# Patient Record
Sex: Male | Born: 1945 | Race: White | Hispanic: No | Marital: Married | Smoking: Former smoker
Health system: Southern US, Community
[De-identification: ages and names within clinical notes are randomized; demographics above are authoritative.]

## PROBLEM LIST (undated history)

## (undated) DIAGNOSIS — Z860100 Personal history of colon polyps, unspecified: Secondary | ICD-10-CM

## (undated) DIAGNOSIS — E785 Hyperlipidemia, unspecified: Secondary | ICD-10-CM

## (undated) DIAGNOSIS — N320 Bladder-neck obstruction: Secondary | ICD-10-CM

## (undated) DIAGNOSIS — Z8639 Personal history of other endocrine, nutritional and metabolic disease: Secondary | ICD-10-CM

## (undated) DIAGNOSIS — D352 Benign neoplasm of pituitary gland: Secondary | ICD-10-CM

## (undated) DIAGNOSIS — I639 Cerebral infarction, unspecified: Secondary | ICD-10-CM

## (undated) DIAGNOSIS — R7303 Prediabetes: Secondary | ICD-10-CM

## (undated) DIAGNOSIS — Q2730 Arteriovenous malformation, site unspecified: Secondary | ICD-10-CM

## (undated) DIAGNOSIS — J449 Chronic obstructive pulmonary disease, unspecified: Secondary | ICD-10-CM

## (undated) DIAGNOSIS — E559 Vitamin D deficiency, unspecified: Secondary | ICD-10-CM

## (undated) DIAGNOSIS — E291 Testicular hypofunction: Secondary | ICD-10-CM

## (undated) HISTORY — PX: TONSILLECTOMY: SUR1361

---

## 1988-11-05 HISTORY — PX: ACHILLES TENDON REPAIR: SUR1153

## 2007-12-22 ENCOUNTER — Ambulatory Visit: Payer: Self-pay | Admitting: Urology

## 2008-07-05 ENCOUNTER — Ambulatory Visit: Payer: Self-pay | Admitting: Unknown Physician Specialty

## 2008-07-05 HISTORY — PX: COLONOSCOPY: SHX174

## 2009-11-28 ENCOUNTER — Ambulatory Visit: Payer: Self-pay | Admitting: Internal Medicine

## 2011-08-10 ENCOUNTER — Ambulatory Visit: Payer: Self-pay | Admitting: Urology

## 2013-09-07 ENCOUNTER — Ambulatory Visit: Payer: Self-pay | Admitting: Unknown Physician Specialty

## 2013-09-07 HISTORY — PX: COLONOSCOPY: SHX174

## 2014-06-28 ENCOUNTER — Ambulatory Visit: Payer: Self-pay | Admitting: Internal Medicine

## 2014-07-05 ENCOUNTER — Ambulatory Visit: Payer: Self-pay | Admitting: Internal Medicine

## 2015-10-12 DIAGNOSIS — R7303 Prediabetes: Secondary | ICD-10-CM | POA: Insufficient documentation

## 2016-03-26 DIAGNOSIS — D352 Benign neoplasm of pituitary gland: Secondary | ICD-10-CM | POA: Insufficient documentation

## 2016-10-10 ENCOUNTER — Other Ambulatory Visit: Payer: Self-pay | Admitting: Urology

## 2016-10-10 DIAGNOSIS — K862 Cyst of pancreas: Secondary | ICD-10-CM

## 2016-10-22 ENCOUNTER — Ambulatory Visit (HOSPITAL_COMMUNITY)
Admission: RE | Admit: 2016-10-22 | Discharge: 2016-10-22 | Disposition: A | Discharge status: Home / Self Care | Payer: BLUE CROSS/BLUE SHIELD | Source: Ambulatory Visit | Attending: Urology | Admitting: Urology

## 2016-10-22 DIAGNOSIS — K862 Cyst of pancreas: Secondary | ICD-10-CM | POA: Insufficient documentation

## 2016-10-22 DIAGNOSIS — N281 Cyst of kidney, acquired: Secondary | ICD-10-CM | POA: Insufficient documentation

## 2016-10-22 LAB — POCT I-STAT CREATININE: CREATININE: 1 mg/dL (ref 0.61–1.24)

## 2016-10-22 MED ORDER — GADOBENATE DIMEGLUMINE 529 MG/ML IV SOLN
20.0000 mL | Freq: Once | INTRAVENOUS | Status: AC | PRN
  Administered 2016-10-22: 20 mL via INTRAVENOUS

## 2017-04-03 DIAGNOSIS — D239 Other benign neoplasm of skin, unspecified: Secondary | ICD-10-CM

## 2017-04-03 HISTORY — DX: Other benign neoplasm of skin, unspecified: D23.9

## 2017-06-16 ENCOUNTER — Encounter: Payer: Self-pay | Admitting: Emergency Medicine

## 2017-06-16 ENCOUNTER — Emergency Department
Admission: EM | Admit: 2017-06-16 | Discharge: 2017-06-16 | Disposition: A | Discharge status: Home / Self Care | Payer: BLUE CROSS/BLUE SHIELD | Attending: Emergency Medicine | Admitting: Emergency Medicine

## 2017-06-16 DIAGNOSIS — N401 Enlarged prostate with lower urinary tract symptoms: Secondary | ICD-10-CM | POA: Insufficient documentation

## 2017-06-16 DIAGNOSIS — Z87891 Personal history of nicotine dependence: Secondary | ICD-10-CM | POA: Diagnosis not present

## 2017-06-16 DIAGNOSIS — N390 Urinary tract infection, site not specified: Secondary | ICD-10-CM | POA: Insufficient documentation

## 2017-06-16 DIAGNOSIS — R339 Retention of urine, unspecified: Secondary | ICD-10-CM

## 2017-06-16 LAB — URINALYSIS, COMPLETE (UACMP) WITH MICROSCOPIC
Bilirubin Urine: NEGATIVE
Glucose, UA: NEGATIVE mg/dL
Hgb urine dipstick: NEGATIVE
Ketones, ur: NEGATIVE mg/dL
Nitrite: NEGATIVE
PROTEIN: NEGATIVE mg/dL
SPECIFIC GRAVITY, URINE: 1.02 (ref 1.005–1.030)
SQUAMOUS EPITHELIAL / LPF: NONE SEEN
pH: 5 (ref 5.0–8.0)

## 2017-06-16 LAB — CHLAMYDIA/NGC RT PCR (ARMC ONLY)
CHLAMYDIA TR: NOT DETECTED
N gonorrhoeae: NOT DETECTED

## 2017-06-16 MED ORDER — CEPHALEXIN 500 MG PO CAPS: 500.0000 mg | ORAL_CAPSULE | Freq: Two times a day (BID) | ORAL | 0 refills | Status: DC

## 2017-06-16 MED ORDER — CEPHALEXIN 500 MG PO CAPS
500.0000 mg | ORAL_CAPSULE | Freq: Once | ORAL | Status: AC
  Administered 2017-06-16: 500 mg via ORAL
  Filled 2017-06-16: qty 1

## 2017-06-16 NOTE — ED Notes (Signed)
Leg bag placed at this time.

## 2017-06-16 NOTE — Discharge Instructions (Signed)
Please follow-up with your urologist by calling tomorrow for an appointment in the next 7-10 days. Please take her antibiotic as prescribed for its entire course. Return to the emergency department for any fever, abdominal pain, or any other symptom personally concerning to yourself.

## 2017-06-16 NOTE — ED Provider Notes (Signed)
[facility] Emergency Department Provider Note  Time seen: 1:42 PM  I have reviewed the triage vital signs and the nursing notes.   HISTORY  Chief Complaint Urinary Retention    HPI Aaron Lamay. is a 71 y.o. male With a past medical history of BPH who presents to the emergency department for urinary retention and possible urinary tract infection. According to the patient he has chronic BPH, and usually has difficulty urinating. He states since yesterday he has been having very cloudy urine/pus with a subjective fever. Denies any nausea, vomiting, diarrhea. He states since this morning he has been having significant lower abdominal pain/fullness and has been unable to urinate. No history of urinary retention previously.  History reviewed. No pertinent past medical history.  There are no active problems to display for this patient.   Past Surgical History:  Procedure Laterality Date  . ACHILLES TENDON REPAIR    . TONSILLECTOMY      Prior to Admission medications   Not on File    No Known Allergies  History reviewed. No pertinent family history.  Social History Social History  Substance Use Topics  . Smoking status: Former Research scientist (life sciences)  . Smokeless tobacco: Never Used  . Alcohol use No    Review of Systems Constitutional: Negative for fever. Cardiovascular: Negative for chest pain. Respiratory: Negative for shortness of breath. Gastrointestinal: positive for lower abdominal pain/fullness, moderate in severity. negative for nausea vomiting or diarrhea Genitourinary: positive for dysuria yesterday, positive for cloudy urine. Positive for urinary retention today. Musculoskeletal: Negative for back pain Neurological: Negative for headache All other ROS negative  ____________________________________________   PHYSICAL EXAM:  VITAL SIGNS: ED Triage Vitals  Enc Vitals Group     BP 06/16/17 1329 (!) 186/96     Pulse Rate 06/16/17 1329 93      Resp 06/16/17 1329 18     Temp 06/16/17 1329 97.8 F (36.6 C)     Temp Source 06/16/17 1329 Oral     SpO2 06/16/17 1329 97 %     Weight 06/16/17 1323 220 lb (99.8 kg)     Height 06/16/17 1323 5\' 11"  (1.803 m)     Head Circumference --      Peak Flow --      Pain Score 06/16/17 1322 5     Pain Loc --      Pain Edu? --      Excl. in [place]? --     Constitutional: Alert and oriented. Well appearing and in no distress.  Eyes: Normal exam ENT   Head: Normocephalic and atraumatic.   Mouth/Throat: Mucous membranes are moist. Cardiovascular: Normal rate, regular rhythm. No murmur Respiratory: Normal respiratory effort without tachypnea nor retractions. Breath sounds are clear  Gastrointestinal: soft, moderate suprapubic tenderness palpation/fullness. Abdomen otherwise benign  Musculoskeletal: Nontender with normal range of motion in all extremities. Neurologic:  Normal speech and language. No gross focal neurologic deficits Skin:  Skin is warm, dry and intact.  Psychiatric: Mood and affect are normal.   ____________________________________________    INITIAL IMPRESSION / ASSESSMENT AND PLAN / ED COURSE  Pertinent labs & imaging results that were available during my care of the patient were reviewed by me and considered in my medical decision making (see chart for details).  patient presents for dysuria, subjective fever at home yesterday, now with urinary retention today. We will check a urinalysis, send a culture. Patient has greater than 1 L on bladder scan we will place  a Foley catheter with a leg bag. Patient has a urologist Dr. Tresa Moore that he already sees. We will have the patient follow-up with urology in 7-10 days for Foley removal. Currently the patient appears well, no distress with normal vitals besides mild tachycardia however given his discomfort this is expected.  6-30 WBCs in urinalysis. We will cover with Keflex as a precaution given white blood cell clumps.  Culture has been sent. We will have the patient follow-up with his urologist. ____________________________________________   FINAL CLINICAL IMPRESSION(S) / ED DIAGNOSES  acute urinary retention UTI   Harvest Dark, MD 06/16/17 1455

## 2017-06-16 NOTE — ED Triage Notes (Signed)
Pt c/o urinary retention since last night at 7pm.  Nurse from [facility] brought pt over and reports that has had pus.  NAD at this time.  Pt c/o discomfort to bladder area.  Subjective fever last night per wife.

## 2017-06-16 NOTE — ED Notes (Signed)
Report received from [facility], pt currently in no distress

## 2017-06-16 NOTE — ED Notes (Signed)
ED Provider at bedside. 

## 2017-06-19 LAB — URINE CULTURE: Culture: >=100000 — AB

## 2017-06-20 NOTE — Discharge Planning (Signed)
8/12 urine culture resulted positive for enterococcus faecalis; pt was discharged home with keflex 500mg  which does not cover enterococcus. Spoke with Dr Kerman Passey in person, he agrees with plan to change to amoxicillin 500mg  bid x 7 days. Called Mr Diltz who states he saw his urologist yesterday in [place] who ordered him abx but couldn't remember the name, but got them filled at [facility] on [address]. Called and spoke with pharmacist at [facility], confirmed augmentin 875mg  po bid x 7 days was ordered and filled. Called Mr Chrismer back and told him to continue augmentin and quit taking keflex per MD. He verbalized understanding and appreciated the follow up.   Thomasenia Sales, PharmD, MBA, [facility]

## 2018-10-31 ENCOUNTER — Encounter: Payer: Self-pay | Admitting: *Deleted

## 2018-11-03 ENCOUNTER — Ambulatory Visit: Payer: BLUE CROSS/BLUE SHIELD | Admitting: Certified Registered Nurse Anesthetist

## 2018-11-03 ENCOUNTER — Encounter
Admission: RE | Disposition: A | Discharge status: Home / Self Care | Payer: Self-pay | Source: Home / Self Care | Attending: Unknown Physician Specialty

## 2018-11-03 ENCOUNTER — Other Ambulatory Visit: Payer: Self-pay

## 2018-11-03 ENCOUNTER — Ambulatory Visit
Admission: RE | Admit: 2018-11-03 | Discharge: 2018-11-03 | Disposition: A | Discharge status: Home / Self Care | Payer: BLUE CROSS/BLUE SHIELD | Attending: Unknown Physician Specialty | Admitting: Unknown Physician Specialty

## 2018-11-03 DIAGNOSIS — K635 Polyp of colon: Secondary | ICD-10-CM | POA: Diagnosis not present

## 2018-11-03 DIAGNOSIS — K640 First degree hemorrhoids: Secondary | ICD-10-CM | POA: Insufficient documentation

## 2018-11-03 DIAGNOSIS — E785 Hyperlipidemia, unspecified: Secondary | ICD-10-CM | POA: Insufficient documentation

## 2018-11-03 DIAGNOSIS — Z79899 Other long term (current) drug therapy: Secondary | ICD-10-CM | POA: Diagnosis not present

## 2018-11-03 DIAGNOSIS — Z87891 Personal history of nicotine dependence: Secondary | ICD-10-CM | POA: Diagnosis not present

## 2018-11-03 DIAGNOSIS — Z86010 Personal history of colonic polyps: Secondary | ICD-10-CM | POA: Insufficient documentation

## 2018-11-03 DIAGNOSIS — Z1211 Encounter for screening for malignant neoplasm of colon: Secondary | ICD-10-CM | POA: Diagnosis not present

## 2018-11-03 DIAGNOSIS — E559 Vitamin D deficiency, unspecified: Secondary | ICD-10-CM | POA: Diagnosis not present

## 2018-11-03 DIAGNOSIS — J449 Chronic obstructive pulmonary disease, unspecified: Secondary | ICD-10-CM | POA: Diagnosis not present

## 2018-11-03 HISTORY — DX: Personal history of other endocrine, nutritional and metabolic disease: Z86.39

## 2018-11-03 HISTORY — PX: COLONOSCOPY WITH PROPOFOL: SHX5780

## 2018-11-03 HISTORY — DX: Vitamin D deficiency, unspecified: E55.9

## 2018-11-03 HISTORY — DX: Chronic obstructive pulmonary disease, unspecified: J44.9

## 2018-11-03 HISTORY — DX: Testicular hypofunction: E29.1

## 2018-11-03 HISTORY — DX: Personal history of colonic polyps: Z86.010

## 2018-11-03 HISTORY — DX: Bladder-neck obstruction: N32.0

## 2018-11-03 HISTORY — DX: Hyperlipidemia, unspecified: E78.5

## 2018-11-03 HISTORY — DX: Personal history of colon polyps, unspecified: Z86.0100

## 2018-11-03 SURGERY — COLONOSCOPY WITH PROPOFOL
Anesthesia: General

## 2018-11-03 MED ORDER — PROPOFOL 10 MG/ML IV BOLUS
INTRAVENOUS | Status: DC | PRN
  Administered 2018-11-03: 100 mg via INTRAVENOUS

## 2018-11-03 MED ORDER — SODIUM CHLORIDE 0.9 % IV SOLN
INTRAVENOUS | Status: DC
  Administered 2018-11-03: 08:00:00 via INTRAVENOUS

## 2018-11-03 MED ORDER — SODIUM CHLORIDE 0.9 % IV SOLN: INTRAVENOUS | Status: DC

## 2018-11-03 MED ORDER — PROPOFOL 500 MG/50ML IV EMUL
INTRAVENOUS | Status: AC
  Filled 2018-11-03: qty 50

## 2018-11-03 MED ORDER — PROPOFOL 500 MG/50ML IV EMUL
INTRAVENOUS | Status: DC | PRN
  Administered 2018-11-03: 80 ug/kg/min via INTRAVENOUS

## 2018-11-03 MED ORDER — LIDOCAINE HCL (CARDIAC) PF 100 MG/5ML IV SOSY
PREFILLED_SYRINGE | INTRAVENOUS | Status: DC | PRN
  Administered 2018-11-03: 40 mg via INTRATRACHEAL

## 2018-11-03 MED ORDER — LIDOCAINE HCL (PF) 2 % IJ SOLN
INTRAMUSCULAR | Status: AC
  Filled 2018-11-03: qty 10

## 2018-11-03 NOTE — Anesthesia Postprocedure Evaluation (Signed)
Anesthesia Post Note  Patient: Aaron Graham.  Procedure(s) Performed: COLONOSCOPY WITH PROPOFOL (N/A )  Patient location during evaluation: Endoscopy Anesthesia Type: General Level of consciousness: awake and alert Pain management: pain level controlled Vital Signs Assessment: post-procedure vital signs reviewed and stable Respiratory status: spontaneous breathing, nonlabored ventilation, respiratory function stable and patient connected to nasal cannula oxygen Cardiovascular status: blood pressure returned to baseline and stable Postop Assessment: no apparent nausea or vomiting Anesthetic complications: no     Last Vitals:  Vitals:   11/03/18 0911 11/03/18 0921  BP: 110/66 115/71  Pulse: (!) 53 (!) 51  Resp: 18 (!) 23  Temp:    SpO2: 95% 95%    Last Pain:  Vitals:   11/03/18 0921  TempSrc:   PainSc: 0-No pain                 Martha Clan

## 2018-11-03 NOTE — Transfer of Care (Signed)
Immediate Anesthesia Transfer of Care Note  Patient: Aaron Graham.  Procedure(s) Performed: COLONOSCOPY WITH PROPOFOL (N/A )  Patient Location: PACU and Endoscopy Unit  Anesthesia Type:General  Level of Consciousness: awake, alert  and oriented  Airway & Oxygen Therapy: Patient Spontanous Breathing  Post-op Assessment: Report given to RN and Post -op Vital signs reviewed and stable  Post vital signs: Reviewed and stable  Last Vitals:  Vitals Value Taken Time  BP 86/51 11/03/2018  8:51 AM  Temp 36.2 C 11/03/2018  8:51 AM  Pulse 66 11/03/2018  8:51 AM  Resp 23 11/03/2018  8:51 AM  SpO2 96 % 11/03/2018  8:51 AM    Last Pain:  Vitals:   11/03/18 0851  TempSrc: Tympanic  PainSc: 0-No pain         Complications: No apparent anesthesia complications

## 2018-11-03 NOTE — Op Note (Signed)
[facility] Gastroenterology Patient Name: Aaron Graham Procedure Date: 11/03/2018 8:20 AM MRN: [id number] Account #: [id number] Date of Birth: 1946/04/13 Admit Type: Outpatient Age: 72 Room: [facility] ENDO ROOM 1 Gender: Male Note Status: Finalized Procedure:            Colonoscopy Indications:          High risk colon cancer surveillance: Personal history                        of colonic polyps Providers:            Manya Silvas, MD Referring MD:         Ramonita Lab, MD (Referring MD) Medicines:            Propofol per Anesthesia Complications:        No immediate complications. Procedure:            Pre-Anesthesia Assessment:                       - After reviewing the risks and benefits, the patient                        was deemed in satisfactory condition to undergo the                        procedure.                       After obtaining informed consent, the colonoscope was                        passed under direct vision. Throughout the procedure,                        the patient's blood pressure, pulse, and oxygen                        saturations were monitored continuously. The                        Colonoscope was introduced through the anus and                        advanced to the the cecum, identified by appendiceal                        orifice and ileocecal valve. The colonoscopy was                        performed without difficulty. The patient tolerated the                        procedure well. The quality of the bowel preparation                        was excellent. Findings:      A diminutive polyp was found in the sigmoid colon. The polyp was       sessile. The polyp was removed with a jumbo cold forceps. Resection and       retrieval were complete.      A small polyp was found  in the recto-sigmoid colon. The polyp was       sessile. The polyp was removed with a hot snare. Resection and retrieval       were complete.  Internal hemorrhoids were found during endoscopy. The hemorrhoids were       small and Grade I (internal hemorrhoids that do not prolapse).      The exam was otherwise without abnormality. Impression:           - One diminutive polyp in the sigmoid colon, removed                        with a jumbo cold forceps. Resected and retrieved.                       - One small polyp at the recto-sigmoid colon, removed                        with a hot snare. Resected and retrieved.                       - Internal hemorrhoids.                       - The examination was otherwise normal. Recommendation:       - Await pathology results. Manya Silvas, MD 11/03/2018 8:49:19 AM This report has been signed electronically. Number of Addenda: 0 Note Initiated On: 11/03/2018 8:20 AM Scope Withdrawal Time: 0 hours 18 minutes 1 second  Total Procedure Duration: 0 hours 21 minutes 15 seconds       [facility]

## 2018-11-03 NOTE — Anesthesia Preprocedure Evaluation (Signed)
Anesthesia Evaluation  Patient identified by MRN, date of birth, ID band Patient awake    Reviewed: Allergy & Precautions, H&P , NPO status , Patient's Chart, lab work & pertinent test results, reviewed documented beta blocker date and time   History of Anesthesia Complications Negative for: history of anesthetic complications  Airway Mallampati: III  TM Distance: >3 FB Neck ROM: full    Dental  (+) Dental Advidsory Given, Caps, Teeth Intact   Pulmonary neg shortness of breath, COPD, Recent URI , former smoker,           Cardiovascular Exercise Tolerance: Good negative cardio ROS       Neuro/Psych negative neurological ROS  negative psych ROS   GI/Hepatic negative GI ROS, Neg liver ROS,   Endo/Other  negative endocrine ROS  Renal/GU negative Renal ROS  negative genitourinary   Musculoskeletal   Abdominal   Peds  Hematology negative hematology ROS (+)   Anesthesia Other Findings Past Medical History: No date: Bladder outlet obstruction No date: COPD (chronic obstructive pulmonary disease) (HCC) No date: History of colon polyps No date: History of hyperglycemia No date: Hyperlipidemia No date: Hypogonadism in male No date: Vitamin D deficiency   Reproductive/Obstetrics negative OB ROS                             Anesthesia Physical Anesthesia Plan  ASA: II  Anesthesia Plan: General   Post-op Pain Management:    Induction:   PONV Risk Score and Plan: 2 and Propofol infusion and TIVA  Airway Management Planned:   Additional Equipment:   Intra-op Plan:   Post-operative Plan:   Informed Consent: I have reviewed the patients History and Physical, chart, labs and discussed the procedure including the risks, benefits and alternatives for the proposed anesthesia with the patient or authorized representative who has indicated his/her understanding and acceptance.   Dental  Advisory Given  Plan Discussed with: Anesthesiologist, CRNA and Surgeon  Anesthesia Plan Comments:         Anesthesia Quick Evaluation

## 2018-11-03 NOTE — Anesthesia Post-op Follow-up Note (Signed)
Anesthesia QCDR form completed.        

## 2018-11-03 NOTE — H&P (Signed)
Primary Care Physician:  Adin Hector, MD Primary Gastroenterologist:  Dr. Vira Agar  Pre-Procedure History & Physical: HPI:  Aaron Graham. is a 72 y.o. male is here for an colonoscopy.  This is for previous colon polyps.   Past Medical History:  Diagnosis Date  . Bladder outlet obstruction   . COPD (chronic obstructive pulmonary disease) ([place])   . History of colon polyps   . History of hyperglycemia   . Hyperlipidemia   . Hypogonadism in male   . Vitamin D deficiency     Past Surgical History:  Procedure Laterality Date  . ACHILLES TENDON REPAIR  1990  . COLONOSCOPY  07/05/2008  . COLONOSCOPY  09/07/2013  . TONSILLECTOMY      Prior to Admission medications   Medication Sig Start Date End Date Taking? Authorizing Provider  atorvastatin (LIPITOR) 40 MG tablet Take 40 mg by mouth daily.   Yes [provider]  cabergoline (DOSTINEX) 0.5 MG tablet Take 0.25 mg by mouth once a week.   Yes [provider]  Cholecalciferol (VITAMIN D3 PO) Take 2,000 Units by mouth.   Yes [provider]  diazepam (VALIUM) 5 MG tablet Take 2.5 mg by mouth daily.    Yes [provider]  sertraline (ZOLOFT) 50 MG tablet Take 50 mg by mouth daily.   Yes [provider]  sildenafil (VIAGRA) 100 MG tablet Take 100 mg by mouth as needed for erectile dysfunction.   Yes [provider]  tamsulosin (FLOMAX) 0.4 MG CAPS capsule Take 0.4 mg by mouth.   Yes [provider]  testosterone cypionate (DEPOTESTOTERONE CYPIONATE) 100 MG/ML injection Inject into the muscle every 7 (seven) days. For IM use only   Yes [provider]  zolpidem (AMBIEN) 10 MG tablet Take 10 mg by mouth as needed for sleep.   Yes [provider]  cephALEXin (KEFLEX) 500 MG capsule Take 1 capsule (500 mg total) by mouth 2 (two) times daily. Patient not taking: Reported on 11/03/2018 06/16/17   Harvest Dark, MD    Allergies as of 08/22/2018   . (No Known Allergies)    Family History  Problem Relation Age of Onset  . Osteoporosis Mother   . Coronary artery disease Mother   . Ovarian cancer Mother   . Heart attack Father   . Coronary artery disease Father     Social History   Socioeconomic History  . Marital status: Married    Spouse name: Not on file  . Number of children: Not on file  . Years of education: Not on file  . Highest education level: Not on file  Occupational History  . Not on file  Social Needs  . Financial resource strain: Not on file  . Food insecurity:    Worry: Not on file    Inability: Not on file  . Transportation needs:    Medical: Not on file    Non-medical: Not on file  Tobacco Use  . Smoking status: Former Smoker    Last attempt to quit: 1998    Years since quitting: 22.0  . Smokeless tobacco: Never Used  Substance and Sexual Activity  . Alcohol use: No  . Drug use: No  . Sexual activity: Not on file  Lifestyle  . Physical activity:    Days per week: Not on file    Minutes per session: Not on file  . Stress: Not on file  Relationships  . Social connections:  Talks on phone: Not on file    Gets together: Not on file    Attends religious service: Not on file    Active member of club or organization: Not on file    Attends meetings of clubs or organizations: Not on file    Relationship status: Not on file  . Intimate partner violence:    Fear of current or ex partner: Not on file    Emotionally abused: Not on file    Physically abused: Not on file    Forced sexual activity: Not on file  Other Topics Concern  . Not on file  Social History Narrative  . Not on file    Review of Systems: See HPI, otherwise negative ROS  Physical Exam: BP 139/68   Pulse 70   Temp 98.1 F (36.7 C) (Oral)   Resp 18   Ht 5\' 8"  (1.727 m)   Wt 95.3 kg   SpO2 95%   BMI 31.93 kg/m  General:   Alert,  pleasant and cooperative in NAD Head:  Normocephalic and atraumatic. Neck:   Supple; no masses or thyromegaly. Lungs:  Clear throughout to auscultation.    Heart:  Regular rate and rhythm. Abdomen:  Soft, nontender and nondistended. Normal bowel sounds, without guarding, and without rebound.   Neurologic:  Alert and  oriented x4;  grossly normal neurologically.  Impression/Plan: [name]. is here for an colonoscopy to be performed for [facility] colon polyps last one 09/07/2013  Risks, benefits, limitations, and alternatives regarding  colonoscopy have been reviewed with the patient.  Questions have been answered.  All parties agreeable.   Gaylyn Cheers, MD  11/03/2018, 8:17 AM

## 2018-11-04 ENCOUNTER — Encounter: Payer: Self-pay | Admitting: Unknown Physician Specialty

## 2018-11-04 LAB — SURGICAL PATHOLOGY

## 2020-08-10 ENCOUNTER — Other Ambulatory Visit: Payer: Self-pay

## 2020-08-10 ENCOUNTER — Ambulatory Visit: Payer: BC Managed Care – PPO | Admitting: Dermatology

## 2020-08-10 ENCOUNTER — Encounter: Payer: Self-pay | Admitting: Dermatology

## 2020-08-10 DIAGNOSIS — L814 Other melanin hyperpigmentation: Secondary | ICD-10-CM

## 2020-08-10 DIAGNOSIS — L821 Other seborrheic keratosis: Secondary | ICD-10-CM

## 2020-08-10 DIAGNOSIS — L570 Actinic keratosis: Secondary | ICD-10-CM

## 2020-08-10 DIAGNOSIS — D1800 Hemangioma unspecified site: Secondary | ICD-10-CM

## 2020-08-10 DIAGNOSIS — Z86018 Personal history of other benign neoplasm: Secondary | ICD-10-CM

## 2020-08-10 DIAGNOSIS — L853 Xerosis cutis: Secondary | ICD-10-CM | POA: Diagnosis not present

## 2020-08-10 DIAGNOSIS — Z1283 Encounter for screening for malignant neoplasm of skin: Secondary | ICD-10-CM

## 2020-08-10 DIAGNOSIS — D229 Melanocytic nevi, unspecified: Secondary | ICD-10-CM

## 2020-08-10 DIAGNOSIS — L578 Other skin changes due to chronic exposure to nonionizing radiation: Secondary | ICD-10-CM

## 2020-08-10 NOTE — Patient Instructions (Signed)
Recommend Dove for sensitive skin for soap, and Cerave cream daily for moisturizer

## 2020-08-10 NOTE — Progress Notes (Signed)
   Follow-Up Visit   Subjective  Aaron Graham. is a 74 y.o. male who presents for the following: Annual Exam (Total body skin exam, hx of dysplastic nevus L lat infrapectoral).  The following portions of the chart were reviewed this encounter and updated as appropriate:  Tobacco  Allergies  Meds  Problems  Med Hx  Surg Hx  Fam Hx     Review of Systems:  No other skin or systemic complaints except as noted in HPI or Assessment and Plan.  Objective  Well appearing patient in no apparent distress; mood and affect are within normal limits.  A full examination was performed including scalp, head, eyes, ears, nose, lips, neck, chest, axillae, abdomen, back, buttocks, bilateral upper extremities, bilateral lower extremities, hands, feet, fingers, toes, fingernails, and toenails. All findings within normal limits unless otherwise noted below.  Objective  L lat infrapectoral: Scar with no evidence of recurrence.   Objective  face x 4 (4): Pink scaly macules   Objective  arms, legs, trunk: Generalized xerosis   Assessment & Plan    Lentigines - Scattered tan macules - Discussed due to sun exposure - Benign, observe - Call for any changes  Seborrheic Keratoses - Stuck-on, waxy, tan-brown papules and plaques  - Discussed benign etiology and prognosis. - Observe - Call for any changes  Melanocytic Nevi - Tan-brown and/or pink-flesh-colored symmetric macules and papules - Benign appearing on exam today - Observation - Call clinic for new or changing moles - Recommend daily use of broad spectrum spf 30+ sunscreen to sun-exposed areas.   Hemangiomas - Red papules - Discussed benign nature - Observe - Call for any changes  Actinic Damage - diffuse scaly erythematous macules with underlying dyspigmentation - Recommend daily broad spectrum sunscreen SPF 30+ to sun-exposed areas, reapply every 2 hours as needed.  - Call for new or changing lesions.  Skin  cancer screening performed today.   History of dysplastic nevus L lat infrapectoral  Clear. Observe for recurrence. Call clinic for new or changing lesions.  Recommend regular skin exams, daily broad-spectrum spf 30+ sunscreen use, and photoprotection.     AK (actinic keratosis) (4) face x 4  Destruction of lesion - face x 4 Complexity: simple   Destruction method: cryotherapy   Informed consent: discussed and consent obtained   Timeout:  patient name, date of birth, surgical site, and procedure verified Lesion destroyed using liquid nitrogen: Yes   Region frozen until ice ball extended beyond lesion: Yes   Outcome: patient tolerated procedure well with no complications   Post-procedure details: wound care instructions given    Xerosis cutis arms, legs, trunk  Recommend mild soap and moisturizer, Dove soap for sensitive skin, cerave cream  Return for TBSE, hx of Dysplastic nevus, AKs.  I, Sonya Hupman, RMA, am acting as scribe for Sarina Ser, MD .  Documentation: I have reviewed the above documentation for accuracy and completeness, and I agree with the above.  Sarina Ser, MD

## 2020-08-11 ENCOUNTER — Encounter: Payer: Self-pay | Admitting: Dermatology

## 2021-06-16 ENCOUNTER — Other Ambulatory Visit: Payer: Self-pay | Admitting: Internal Medicine

## 2021-06-16 DIAGNOSIS — R739 Hyperglycemia, unspecified: Secondary | ICD-10-CM

## 2021-06-16 DIAGNOSIS — D352 Benign neoplasm of pituitary gland: Secondary | ICD-10-CM

## 2021-06-28 ENCOUNTER — Ambulatory Visit
Admission: RE | Admit: 2021-06-28 | Discharge: 2021-06-28 | Disposition: A | Discharge status: Home / Self Care | Payer: Medicare Other | Source: Ambulatory Visit | Attending: Internal Medicine | Admitting: Internal Medicine

## 2021-06-28 ENCOUNTER — Other Ambulatory Visit: Payer: Self-pay

## 2021-06-28 DIAGNOSIS — R739 Hyperglycemia, unspecified: Secondary | ICD-10-CM | POA: Insufficient documentation

## 2021-06-28 DIAGNOSIS — D352 Benign neoplasm of pituitary gland: Secondary | ICD-10-CM | POA: Diagnosis not present

## 2021-06-28 MED ORDER — GADOBUTROL 1 MMOL/ML IV SOLN
7.0000 mL | Freq: Once | INTRAVENOUS | Status: AC | PRN
  Administered 2021-06-28: 7 mL via INTRAVENOUS

## 2021-07-14 ENCOUNTER — Other Ambulatory Visit: Payer: Self-pay | Admitting: Neurosurgery

## 2021-07-14 DIAGNOSIS — Q2730 Arteriovenous malformation, site unspecified: Secondary | ICD-10-CM

## 2021-07-18 ENCOUNTER — Other Ambulatory Visit: Payer: Self-pay

## 2021-07-18 ENCOUNTER — Ambulatory Visit
Admission: RE | Admit: 2021-07-18 | Discharge: 2021-07-18 | Disposition: A | Discharge status: Home / Self Care | Payer: Medicare Other | Source: Ambulatory Visit | Attending: Neurosurgery | Admitting: Neurosurgery

## 2021-07-18 DIAGNOSIS — Q2730 Arteriovenous malformation, site unspecified: Secondary | ICD-10-CM | POA: Insufficient documentation

## 2021-07-18 LAB — POCT I-STAT CREATININE: Creatinine, Ser: 1 mg/dL (ref 0.61–1.24)

## 2021-07-18 MED ORDER — IOHEXOL 350 MG/ML SOLN
80.0000 mL | Freq: Once | INTRAVENOUS | Status: AC | PRN
  Administered 2021-07-18: 80 mL via INTRAVENOUS

## 2021-08-10 ENCOUNTER — Encounter: Payer: BC Managed Care – PPO | Admitting: Dermatology

## 2021-09-13 ENCOUNTER — Encounter: Payer: Self-pay | Admitting: Dermatology

## 2021-09-13 ENCOUNTER — Ambulatory Visit (INDEPENDENT_AMBULATORY_CARE_PROVIDER_SITE_OTHER): Payer: Medicare Other | Admitting: Dermatology

## 2021-09-13 ENCOUNTER — Other Ambulatory Visit: Payer: Self-pay

## 2021-09-13 DIAGNOSIS — Z1283 Encounter for screening for malignant neoplasm of skin: Secondary | ICD-10-CM | POA: Diagnosis not present

## 2021-09-13 DIAGNOSIS — L821 Other seborrheic keratosis: Secondary | ICD-10-CM

## 2021-09-13 DIAGNOSIS — L570 Actinic keratosis: Secondary | ICD-10-CM

## 2021-09-13 DIAGNOSIS — D1800 Hemangioma unspecified site: Secondary | ICD-10-CM

## 2021-09-13 DIAGNOSIS — D229 Melanocytic nevi, unspecified: Secondary | ICD-10-CM | POA: Diagnosis not present

## 2021-09-13 DIAGNOSIS — L578 Other skin changes due to chronic exposure to nonionizing radiation: Secondary | ICD-10-CM

## 2021-09-13 DIAGNOSIS — L814 Other melanin hyperpigmentation: Secondary | ICD-10-CM

## 2021-09-13 DIAGNOSIS — Z86018 Personal history of other benign neoplasm: Secondary | ICD-10-CM

## 2021-09-13 NOTE — Progress Notes (Signed)
Follow-Up Visit   Subjective  Aaron Graham. is a 75 y.o. male who presents for the following: Annual Exam (Mole check ). Yearly mole check, hx of Dysplastic nevus. The patient presents for Total-Body Skin Exam (TBSE) for skin cancer screening and mole check.   The following portions of the chart were reviewed this encounter and updated as appropriate:   Tobacco  Allergies  Meds  Problems  Med Hx  Surg Hx  Fam Hx     Review of Systems:  No other skin or systemic complaints except as noted in HPI or Assessment and Plan.  Objective  Well appearing patient in no apparent distress; mood and affect are within normal limits.  A full examination was performed including scalp, head, eyes, ears, nose, lips, neck, chest, axillae, abdomen, back, buttocks, bilateral upper extremities, bilateral lower extremities, hands, feet, fingers, toes, fingernails, and toenails. All findings within normal limits unless otherwise noted below.  face x 7 (7) Erythematous thin papules/macules with gritty scale.    Assessment & Plan  AK (actinic keratosis) (7) face x 7  Actinic keratoses are precancerous spots that appear secondary to cumulative UV radiation exposure/sun exposure over time. They are chronic with expected duration over 1 year. A portion of actinic keratoses will progress to squamous cell carcinoma of the skin. It is not possible to reliably predict which spots will progress to skin cancer and so treatment is recommended to prevent development of skin cancer.  Recommend daily broad spectrum sunscreen SPF 30+ to sun-exposed areas, reapply every 2 hours as needed.  Recommend staying in the shade or wearing long sleeves, sun glasses (UVA+UVB protection) and wide brim hats (4-inch brim around the entire circumference of the hat). Call for new or changing lesions.   Prior to procedure, discussed risks of blister formation, small wound, skin dyspigmentation, or rare scar following  cryotherapy. Recommend Vaseline ointment to treated areas while healing.   Destruction of lesion - face x 7 Complexity: simple   Destruction method: cryotherapy   Informed consent: discussed and consent obtained   Timeout:  patient name, date of birth, surgical site, and procedure verified Lesion destroyed using liquid nitrogen: Yes   Region frozen until ice ball extended beyond lesion: Yes   Outcome: patient tolerated procedure well with no complications   Post-procedure details: wound care instructions given    Skin cancer screening  Lentigines - Scattered tan macules - Due to sun exposure - Benign-appearing, observe - Recommend daily broad spectrum sunscreen SPF 30+ to sun-exposed areas, reapply every 2 hours as needed. - Call for any changes  Seborrheic Keratoses - Stuck-on, waxy, tan-brown papules and/or plaques  - Benign-appearing - Discussed benign etiology and prognosis. - Observe - Call for any changes  Melanocytic Nevi - Tan-brown and/or pink-flesh-colored symmetric macules and papules - Benign appearing on exam today - Observation - Call clinic for new or changing moles - Recommend daily use of broad spectrum spf 30+ sunscreen to sun-exposed areas.   Hemangiomas - Red papules - Discussed benign nature - Observe - Call for any changes  Actinic Damage - Chronic condition, secondary to cumulative UV/sun exposure - diffuse scaly erythematous macules with underlying dyspigmentation - Recommend daily broad spectrum sunscreen SPF 30+ to sun-exposed areas, reapply every 2 hours as needed.  - Staying in the shade or wearing long sleeves, sun glasses (UVA+UVB protection) and wide brim hats (4-inch brim around the entire circumference of the hat) are also recommended for sun protection.  -  Call for new or changing lesions.  History of Dysplastic Nevi Left infra pectoral  - No evidence of recurrence today - Recommend regular full body skin exams - Recommend daily  broad spectrum sunscreen SPF 30+ to sun-exposed areas, reapply every 2 hours as needed.  - Call if any new or changing lesions are noted between office visits   Skin cancer screening performed today.   Return in about 1 year (around 09/13/2022) for TBSE, hx of Dysplastic nevus .  IMarye Round, CMA, am acting as scribe for Sarina Ser, MD .  Documentation: I have reviewed the above documentation for accuracy and completeness, and I agree with the above.  Sarina Ser, MD

## 2021-09-13 NOTE — Patient Instructions (Addendum)
Gentle Skin Care Guide  1. Bathe no more than once a day.  2. Avoid bathing in hot water  3. Use a mild soap like Dove, Vanicream, Cetaphil, CeraVe. Can use Lever 2000 or Cetaphil antibacterial soap  4. Use soap only where you need it. On most days, use it under your arms, between your legs, and on your feet. Let the water rinse other areas unless visibly dirty.  5. When you get out of the bath/shower, use a towel to gently blot your skin dry, don't rub it.  6. While your skin is still a little damp, apply a moisturizing cream such as Vanicream, CeraVe, Cetaphil, Eucerin, Sarna lotion or plain Vaseline Jelly. For hands apply Neutrogena [place] Hand Cream or Excipial Hand Cream.  7. Reapply moisturizer any time you start to itch or feel dry.  8. Sometimes using free and clear laundry detergents can be helpful. Fabric softener sheets should be avoided. Downy Free & Gentle liquid, or any liquid fabric softener that is free of dyes and perfumes, it acceptable to use  9. If your doctor has given you prescription creams you may apply moisturizers over them       Cryotherapy Aftercare  Wash gently with soap and water everyday.   Apply Vaseline and Band-Aid daily until healed.   Prior to procedure, discussed risks of blister formation, small wound, skin dyspigmentation, or rare scar following cryotherapy. Recommend Vaseline ointment to treated areas while healing.     If you have any questions or concerns for your doctor, please call our main line at [phone] and press option 4 to reach your doctor's medical assistant. If no one answers, please leave a voicemail as directed and we will return your call as soon as possible. Messages left after 4 pm will be answered the following business day.   You may also send [place] a message via [place]. We typically respond to MyChart messages within 1-2 business days.  For prescription refills, please ask your pharmacy to contact our office. Our  fax number is [phone].  If you have an urgent issue when the clinic is closed that cannot wait until the next business day, you can page your doctor at the number below.    Please note that while we do our best to be available for urgent issues outside of office hours, we are not available 24/7.   If you have an urgent issue and are unable to reach [place], you may choose to seek medical care at your doctor's office, retail clinic, urgent care center, or emergency room.  If you have a medical emergency, please immediately call 911 or go to the emergency department.  Pager Numbers  - Dr. Nehemiah Massed: [phone]  - Dr. Laurence Ferrari: [phone]  - Dr. Nicole Kindred: [phone]  In the event of inclement weather, please call our main line at [phone] for an update on the status of any delays or closures.  Dermatology Medication Tips: Please keep the boxes that topical medications come in in order to help keep track of the instructions about where and how to use these. Pharmacies typically print the medication instructions only on the boxes and not directly on the medication tubes.   If your medication is too expensive, please contact our office at [phone] option 4 or send [place] a message through [place].   We are unable to tell what your co-pay for medications will be in advance as this is different depending on your insurance coverage. However, we may be able to  find a substitute medication at lower cost or fill out paperwork to get insurance to cover a needed medication.   If a prior authorization is required to get your medication covered by your insurance company, please allow [place] 1-2 business days to complete this process.  Drug prices often vary depending on where the prescription is filled and some pharmacies may offer cheaper prices.  The website www.goodrx.com contains coupons for medications through different pharmacies. The prices here do not account for what the cost may be with help  from insurance (it may be cheaper with your insurance), but the website can give you the price if you did not use any insurance.  - You can print the associated coupon and take it with your prescription to the pharmacy.  - You may also stop by our office during regular business hours and pick up a GoodRx coupon card.  - If you need your prescription sent electronically to a different pharmacy, notify our office through [facility] or by phone at [phone] option 4.

## 2022-09-11 IMAGING — MR MR HEAD WO/W CM
13 of 19 series · 25 of 48 positions shown · IV contrast (7ml Gadavist)
Comparison: 07/05/2014

CLINICAL DATA: Pituitary adenoma.  Hyperglycemia.

EXAM:
MRI HEAD WITHOUT AND WITH CONTRAST
TECHNIQUE: Multiplanar, multiecho pulse sequences of the brain and surrounding
structures were obtained without and with intravenous contrast.
CONTRAST:  7mL GADAVIST GADOBUTROL 1 MMOL/ML IV SOLN

[Series 5: T1 · sagittal · 5.0mm · 0.62mm/px · 1 of 25 slices shown]
[im 1/25]
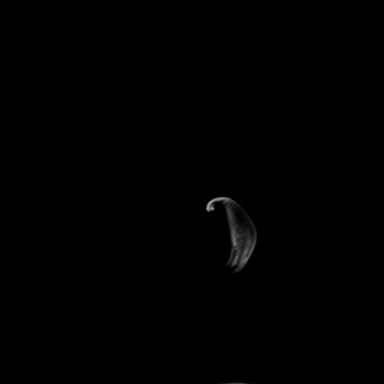

[Series 8: T2 · axial · 5.0mm · 0.53mm/px · z∈[-110,+32]mm · 2 of 25 slices shown (1 of 2)]
[im 1/25]
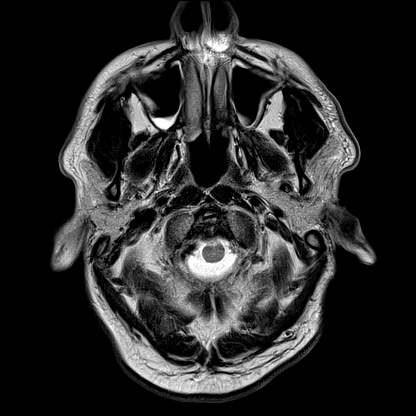
[im 25/25]
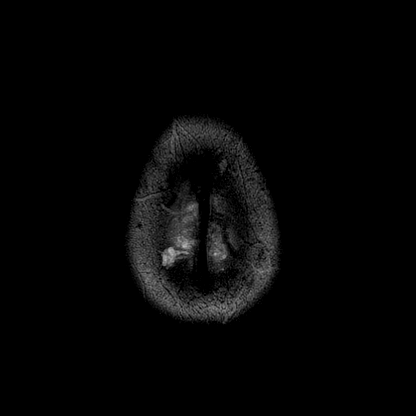

[Series 10: FLAIR · axial · 3.0mm · 0.53mm/px · z∈[-119,+41]mm · 5 of 55 slices shown]
[im 1/55]
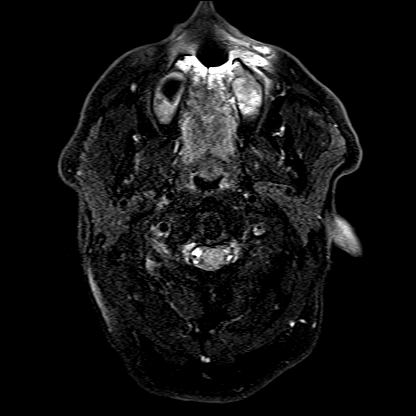
[im 14/55]
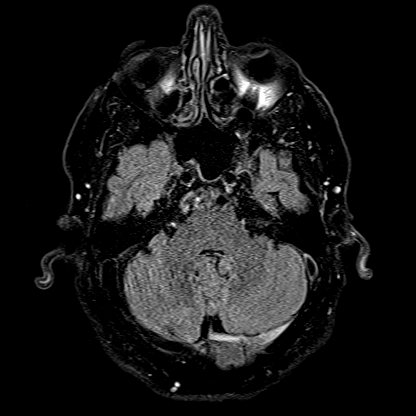
[im 28/55]
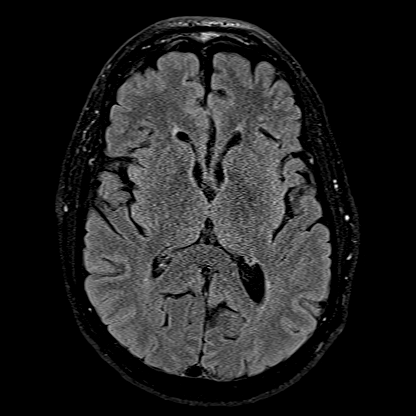
[im 41/55]
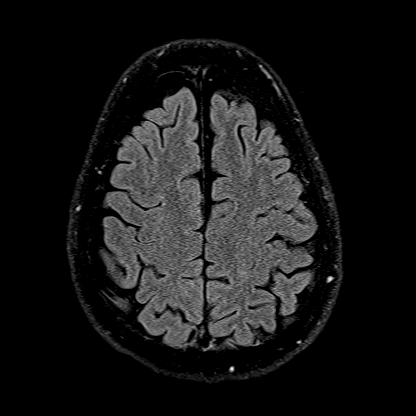
[im 55/55]
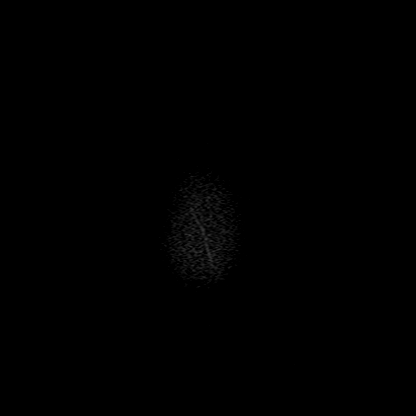

[Series 13: T2 · coronal · 3.0mm · 0.42mm/px · 1 of 13 slices shown (2 of 2)]
[im 1/13]
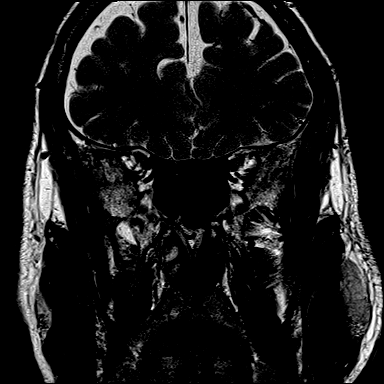

[Series 15: T1 post-contrast · coronal · 3.0mm · 0.28mm/px · 1 of 11 slices shown (1 of 9)]
[im 1/11]
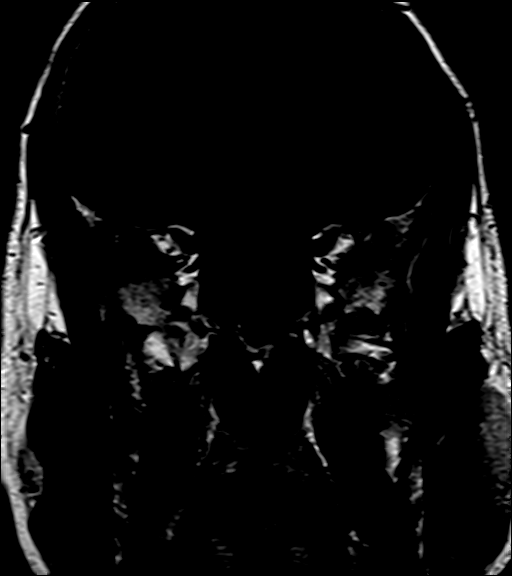

[Series 16: T1 post-contrast · coronal · 3.0mm · 0.28mm/px · 1 of 11 slices shown (2 of 9)]
[im 1/11]
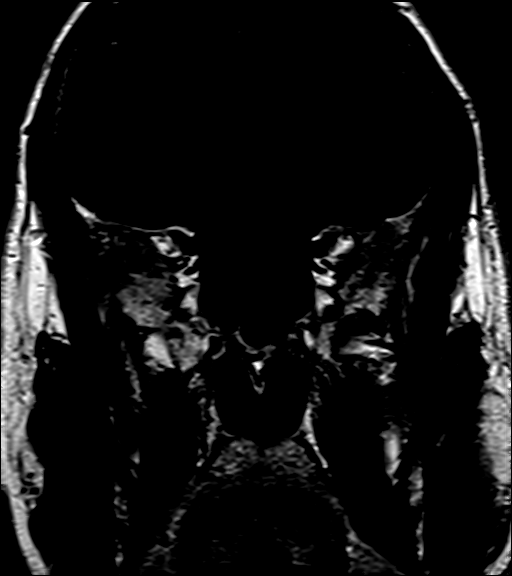

[Series 17: T1 post-contrast · coronal · 3.0mm · 0.28mm/px · 1 of 11 slices shown (3 of 9)]
[im 1/11]
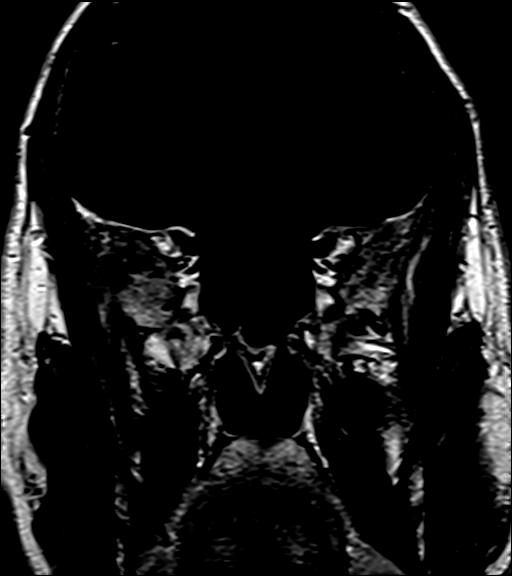

[Series 18: T1 post-contrast · coronal · 3.0mm · 0.28mm/px · 1 of 11 slices shown (4 of 9)]
[im 1/11]
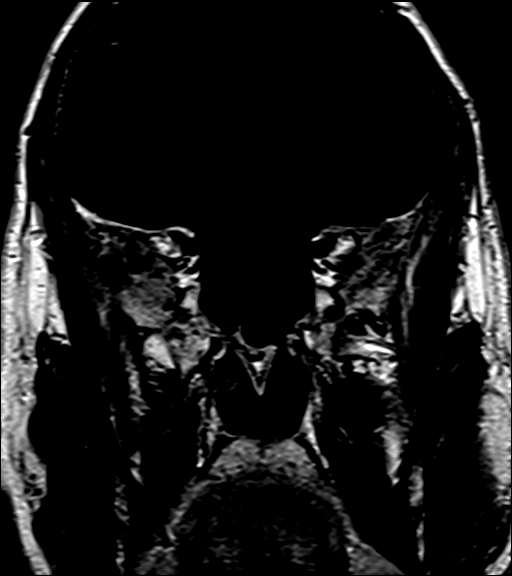

[Series 19: T1 post-contrast · coronal · 3.0mm · 0.28mm/px · 1 of 11 slices shown (5 of 9)]
[im 1/11]
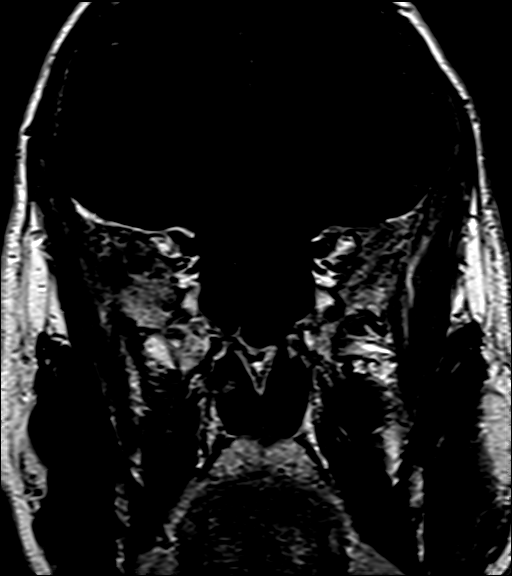

[Series 20: T1 post-contrast · coronal · 3.0mm · 0.28mm/px · 1 of 11 slices shown (6 of 9)]
[im 1/11]
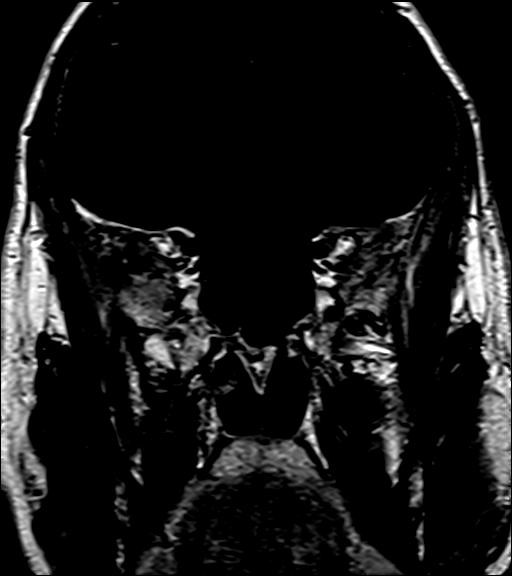

[Series 21: T1 post-contrast · coronal · 3.0mm · 0.21mm/px · 1 of 13 slices shown (7 of 9)]
[im 1/13]
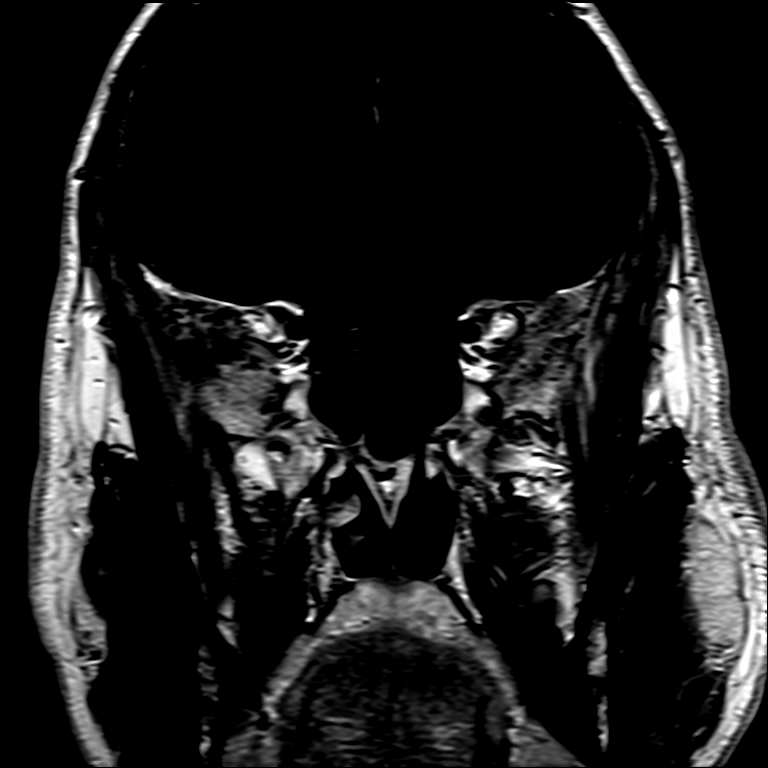

[Series 22: T1 post-contrast · sagittal · 3.0mm · 0.21mm/px · 1 of 13 slices shown (8 of 9)]
[im 1/13]
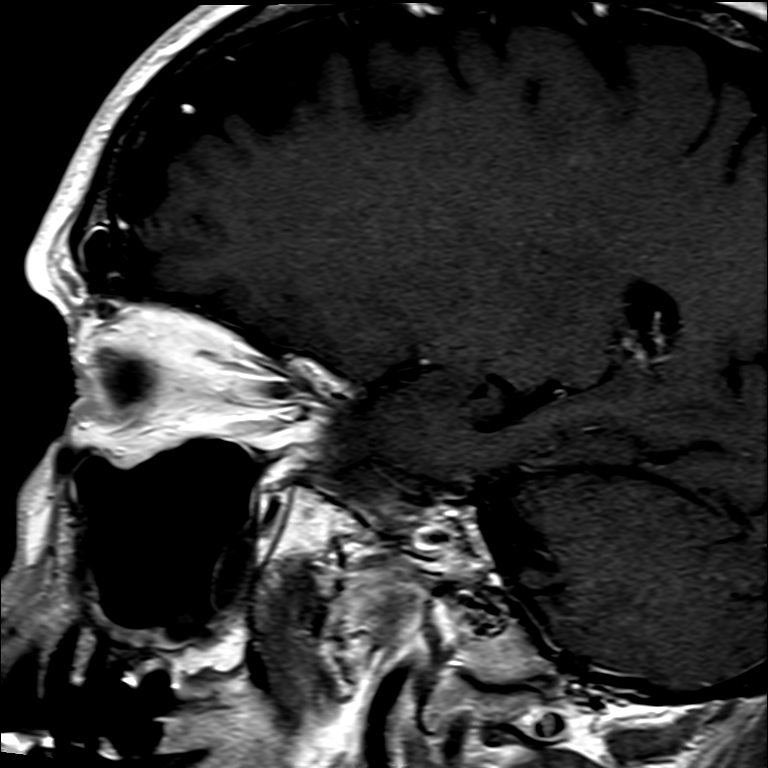

[Series 23: T1 post-contrast · axial · 1.0mm · 0.98mm/px · z∈[-126,+48]mm · 8 of 176 slices shown (9 of 9)]
[im 1/176]
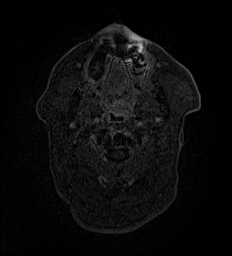
[im 26/176]
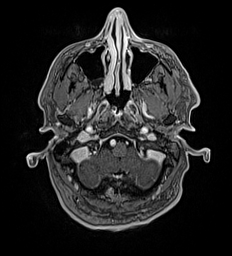
[im 51/176]
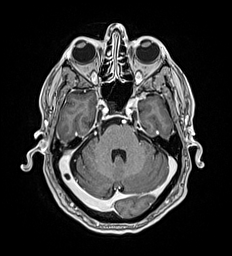
[im 76/176]
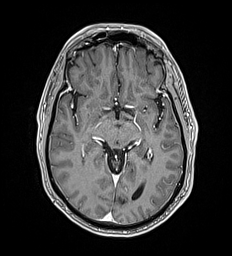
[im 101/176]
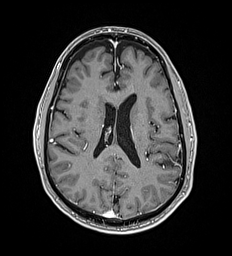
[im 126/176]
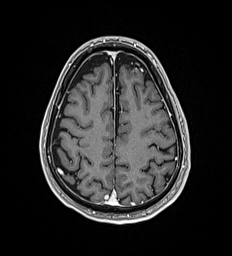
[im 151/176]
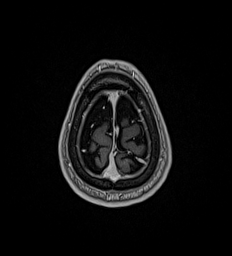
[im 176/176]
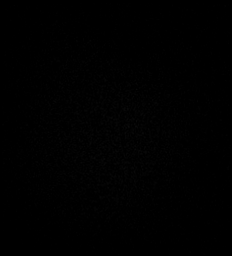

[25 of 48 positions shown; findings below may reference images not displayed]

FINDINGS: Brain: No acute infarct, midline shift, or extra-axial fluid
collection is identified. No significant white matter disease is
seen for age. There is mild cerebral atrophy.

There is a new small focus of susceptibility artifact in the left
superior frontal gyrus with multiple abnormally enlarged vessels in
this location including an enlarged overlying draining vein and
enlarged left ACA branch artery extending into this region
consistent with a vascular malformation, possibly an AVM with an
underlying subcentimeter nidus. There is no associated edema or
evidence of recent hemorrhage.

Dedicated imaging was performed through the sella turcica. The
pituitary gland measures 3 mm in height. In the inferior, anterior
aspect of the gland in the midline is a 3 x 1.5 mm lesion which is
hyperenhancing on delayed postcontrast images (series 22, image 7).
This corresponds to the site of the larger hypoenhancing lesion on a
7771 MRI and is also minimally smaller than the hypoenhancing lesion
on the more recent 0600 study. The infundibulum is midline. The
cavernous sinuses and optic chiasm are unremarkable.

Vascular: Hypoplastic left vertebral artery. Chronically partly
abnormal flow void involving the left transverse and left sigmoid
sinuses compatible with slow flow as opposed to occlusion given
normal enhancement. Arachnoid granulation in the right transverse
sinus.

Skull and upper cervical spine: Unremarkable bone marrow signal.

Sinuses/Orbits: Unremarkable orbits. Mild mucosal thickening in the
maxillary sinuses. Clear mastoid air cells.

Other: None.
IMPRESSION: 1. 3 mm pituitary lesion consistent with a microadenoma, minimally
smaller than in 0600.
2. Small vascular malformation in the left superior frontal gyrus,
not apparent in 0600.

## 2022-09-19 ENCOUNTER — Ambulatory Visit (INDEPENDENT_AMBULATORY_CARE_PROVIDER_SITE_OTHER): Payer: Medicare Other | Admitting: Dermatology

## 2022-09-19 DIAGNOSIS — L578 Other skin changes due to chronic exposure to nonionizing radiation: Secondary | ICD-10-CM | POA: Diagnosis not present

## 2022-09-19 DIAGNOSIS — Z86018 Personal history of other benign neoplasm: Secondary | ICD-10-CM | POA: Diagnosis not present

## 2022-09-19 DIAGNOSIS — D229 Melanocytic nevi, unspecified: Secondary | ICD-10-CM

## 2022-09-19 DIAGNOSIS — L570 Actinic keratosis: Secondary | ICD-10-CM | POA: Diagnosis not present

## 2022-09-19 DIAGNOSIS — Z1283 Encounter for screening for malignant neoplasm of skin: Secondary | ICD-10-CM

## 2022-09-19 DIAGNOSIS — L821 Other seborrheic keratosis: Secondary | ICD-10-CM

## 2022-09-19 DIAGNOSIS — L814 Other melanin hyperpigmentation: Secondary | ICD-10-CM

## 2022-09-19 NOTE — Patient Instructions (Signed)
Cryotherapy Aftercare  Wash gently with soap and water everyday.   Apply Vaseline and Band-Aid daily until healed.     Due to recent changes in healthcare laws, you may see results of your pathology and/or laboratory studies on MyChart before the doctors have had a chance to review them. We understand that in some cases there may be results that are confusing or concerning to you. Please understand that not all results are received at the same time and often the doctors may need to interpret multiple results in order to provide you with the best plan of care or course of treatment. Therefore, we ask that you please give us 2 business days to thoroughly review all your results before contacting the office for clarification. Should we see a critical lab result, you will be contacted sooner.   If You Need Anything After Your Visit  If you have any questions or concerns for your doctor, please call our main line at [phone] and press option 4 to reach your doctor's medical assistant. If no one answers, please leave a voicemail as directed and we will return your call as soon as possible. Messages left after 4 pm will be answered the following business day.   You may also send us a message via MyChart. We typically respond to MyChart messages within 1-2 business days.  For prescription refills, please ask your pharmacy to contact our office. Our fax number is [phone].  If you have an urgent issue when the clinic is closed that cannot wait until the next business day, you can page your doctor at the number below.    Please note that while we do our best to be available for urgent issues outside of office hours, we are not available 24/7.   If you have an urgent issue and are unable to reach us, you may choose to seek medical care at your doctor's office, retail clinic, urgent care center, or emergency room.  If you have a medical emergency, please immediately call 911 or go to the  emergency department.  Pager Numbers  - Dr. Kowalski: [phone]  - Dr. Moye: [phone]  - Dr. Stewart: [phone]  In the event of inclement weather, please call our main line at [phone] for an update on the status of any delays or closures.  Dermatology Medication Tips: Please keep the boxes that topical medications come in in order to help keep track of the instructions about where and how to use these. Pharmacies typically print the medication instructions only on the boxes and not directly on the medication tubes.   If your medication is too expensive, please contact our office at [phone] option 4 or send us a message through MyChart.   We are unable to tell what your co-pay for medications will be in advance as this is different depending on your insurance coverage. However, we may be able to find a substitute medication at lower cost or fill out paperwork to get insurance to cover a needed medication.   If a prior authorization is required to get your medication covered by your insurance company, please allow us 1-2 business days to complete this process.  Drug prices often vary depending on where the prescription is filled and some pharmacies may offer cheaper prices.  The website www.goodrx.com contains coupons for medications through different pharmacies. The prices here do not account for what the cost may be with help from insurance (it may be cheaper with your insurance), but the website can   give you the price if you did not use any insurance.  - You can print the associated coupon and take it with your prescription to the pharmacy.  - You may also stop by our office during regular business hours and pick up a GoodRx coupon card.  - If you need your prescription sent electronically to a different pharmacy, notify our office through [place] MyChart or by phone at [phone] option 4.     Si Usted Necesita Algo Despus de Su Visita  Tambin puede  enviarnos un mensaje a travs de MyChart. Por lo general respondemos a los mensajes de MyChart en el transcurso de 1 a 2 das hbiles.  Para renovar recetas, por favor pida a su farmacia que se ponga en contacto con nuestra oficina. Nuestro nmero de fax es el [phone].  Si tiene un asunto urgente cuando la clnica est cerrada y que no puede esperar hasta el siguiente da hbil, puede llamar/localizar a su doctor(a) al nmero que aparece a continuacin.   Por favor, tenga en cuenta que aunque hacemos todo lo posible para estar disponibles para asuntos urgentes fuera del horario de oficina, no estamos disponibles las 24 horas del da, los 7 das de la semana.   Si tiene un problema urgente y no puede comunicarse con nosotros, puede optar por buscar atencin mdica  en el consultorio de su doctor(a), en una clnica privada, en un centro de atencin urgente o en una sala de emergencias.  Si tiene una emergencia mdica, por favor llame inmediatamente al 911 o vaya a la sala de emergencias.  Nmeros de bper  - Dr. Kowalski: [phone]  - Dra. Moye: [phone]  - Dra. Stewart: [phone]  En caso de inclemencias del tiempo, por favor llame a nuestra lnea principal al [phone] para una actualizacin sobre el estado de cualquier retraso o cierre.  Consejos para la medicacin en dermatologa: Por favor, guarde las cajas en las que vienen los medicamentos de uso tpico para ayudarle a seguir las instrucciones sobre dnde y cmo usarlos. Las farmacias generalmente imprimen las instrucciones del medicamento slo en las cajas y no directamente en los tubos del medicamento.   Si su medicamento es muy caro, por favor, pngase en contacto con nuestra oficina llamando al [phone] y presione la opcin 4 o envenos un mensaje a travs de MyChart.   No podemos decirle cul ser su copago por los medicamentos por adelantado ya que esto es diferente dependiendo de la cobertura de su seguro.  Sin embargo, es posible que podamos encontrar un medicamento sustituto a menor costo o llenar un formulario para que el seguro cubra el medicamento que se considera necesario.   Si se requiere una autorizacin previa para que su compaa de seguros cubra su medicamento, por favor permtanos de 1 a 2 das hbiles para completar este proceso.  Los precios de los medicamentos varan con frecuencia dependiendo del lugar de dnde se surte la receta y alguna farmacias pueden ofrecer precios ms baratos.  El sitio web www.goodrx.com tiene cupones para medicamentos de diferentes farmacias. Los precios aqu no tienen en cuenta lo que podra costar con la ayuda del seguro (puede ser ms barato con su seguro), pero el sitio web puede darle el precio si no utiliz ningn seguro.  - Puede imprimir el cupn correspondiente y llevarlo con su receta a la farmacia.  - Tambin puede pasar por nuestra oficina durante el horario de atencin regular y recoger una tarjeta de cupones de GoodRx.  -   Si necesita que su receta se enve electrnicamente a una farmacia diferente, informe a nuestra oficina a travs de MyChart de [place] o por telfono llamando al [phone] y presione la opcin 4.  

## 2022-09-19 NOTE — Progress Notes (Signed)
   Follow-Up Visit   Subjective  Aaron Graham. is a 76 y.o. male who presents for the following: Annual Exam (History of Dysplastic nevus and AK - The patient presents for Total-Body Skin Exam (TBSE) for skin cancer screening and mole check.  The patient has spots, moles and lesions to be evaluated, some may be new or changing and the patient has concerns that these could be cancer./).  The following portions of the chart were reviewed this encounter and updated as appropriate:   Tobacco  Allergies  Meds  Problems  Med Hx  Surg Hx  Fam Hx     Review of Systems:  No other skin or systemic complaints except as noted in HPI or Assessment and Plan.  Objective  Well appearing patient in no apparent distress; mood and affect are within normal limits.  A full examination was performed including scalp, head, eyes, ears, nose, lips, neck, chest, axillae, abdomen, back, buttocks, bilateral upper extremities, bilateral lower extremities, hands, feet, fingers, toes, fingernails, and toenails. All findings within normal limits unless otherwise noted below.  Scalp, face (6) Erythematous thin papules/macules with gritty scale.    Assessment & Plan   History of Dysplastic Nevi - No evidence of recurrence today - Recommend regular full body skin exams - Recommend daily broad spectrum sunscreen SPF 30+ to sun-exposed areas, reapply every 2 hours as needed.  - Call if any new or changing lesions are noted between office visits  Lentigines - Scattered tan macules - Due to sun exposure - Benign-appearing, observe - Recommend daily broad spectrum sunscreen SPF 30+ to sun-exposed areas, reapply every 2 hours as needed. - Call for any changes  Seborrheic Keratoses - Stuck-on, waxy, tan-brown papules and/or plaques  - Benign-appearing - Discussed benign etiology and prognosis. - Observe - Call for any changes  Melanocytic Nevi - Tan-brown and/or pink-flesh-colored symmetric macules  and papules - Benign appearing on exam today - Observation - Call clinic for new or changing moles - Recommend daily use of broad spectrum spf 30+ sunscreen to sun-exposed areas.   Hemangiomas - Red papules - Discussed benign nature - Observe - Call for any changes  Actinic Damage - Chronic condition, secondary to cumulative UV/sun exposure - diffuse scaly erythematous macules with underlying dyspigmentation - Recommend daily broad spectrum sunscreen SPF 30+ to sun-exposed areas, reapply every 2 hours as needed.  - Staying in the shade or wearing long sleeves, sun glasses (UVA+UVB protection) and wide brim hats (4-inch brim around the entire circumference of the hat) are also recommended for sun protection.  - Call for new or changing lesions.  Skin cancer screening performed today.  AK (actinic keratosis) (6) Scalp, face  Destruction of lesion - Scalp, face Complexity: simple   Destruction method: cryotherapy   Informed consent: discussed and consent obtained   Timeout:  patient name, date of birth, surgical site, and procedure verified Lesion destroyed using liquid nitrogen: Yes   Region frozen until ice ball extended beyond lesion: Yes   Outcome: patient tolerated procedure well with no complications   Post-procedure details: wound care instructions given     Return in about 1 year (around 09/20/2023) for TBSE.  I, Ashok Cordia, CMA, am acting as scribe for Sarina Ser, MD . Documentation: I have reviewed the above documentation for accuracy and completeness, and I agree with the above.  Sarina Ser, MD

## 2022-10-04 ENCOUNTER — Encounter: Payer: Self-pay | Admitting: Dermatology

## 2022-12-26 DIAGNOSIS — Q282 Arteriovenous malformation of cerebral vessels: Secondary | ICD-10-CM | POA: Insufficient documentation

## 2022-12-28 ENCOUNTER — Other Ambulatory Visit: Payer: Self-pay | Admitting: Internal Medicine

## 2022-12-28 DIAGNOSIS — D352 Benign neoplasm of pituitary gland: Secondary | ICD-10-CM

## 2022-12-31 ENCOUNTER — Other Ambulatory Visit: Payer: Self-pay | Admitting: Internal Medicine

## 2022-12-31 DIAGNOSIS — R7303 Prediabetes: Secondary | ICD-10-CM

## 2022-12-31 DIAGNOSIS — Q2730 Arteriovenous malformation, site unspecified: Secondary | ICD-10-CM

## 2023-01-09 ENCOUNTER — Ambulatory Visit
Admission: RE | Admit: 2023-01-09 | Discharge: 2023-01-09 | Disposition: A | Discharge status: Home / Self Care | Payer: Medicare Other | Source: Ambulatory Visit | Attending: Internal Medicine | Admitting: Internal Medicine

## 2023-01-09 DIAGNOSIS — D352 Benign neoplasm of pituitary gland: Secondary | ICD-10-CM | POA: Insufficient documentation

## 2023-01-09 MED ORDER — GADOBUTROL 1 MMOL/ML IV SOLN
6.0000 mL | Freq: Once | INTRAVENOUS | Status: AC | PRN
  Administered 2023-01-09: 6 mL via INTRAVENOUS

## 2023-01-15 ENCOUNTER — Ambulatory Visit: Payer: Medicare Other

## 2023-02-20 ENCOUNTER — Ambulatory Visit (INDEPENDENT_AMBULATORY_CARE_PROVIDER_SITE_OTHER): Payer: Medicare Other | Admitting: Podiatry

## 2023-02-20 ENCOUNTER — Encounter: Payer: Self-pay | Admitting: Podiatry

## 2023-02-20 ENCOUNTER — Ambulatory Visit (INDEPENDENT_AMBULATORY_CARE_PROVIDER_SITE_OTHER): Payer: Medicare Other

## 2023-02-20 DIAGNOSIS — E785 Hyperlipidemia, unspecified: Secondary | ICD-10-CM | POA: Insufficient documentation

## 2023-02-20 DIAGNOSIS — E559 Vitamin D deficiency, unspecified: Secondary | ICD-10-CM | POA: Insufficient documentation

## 2023-02-20 DIAGNOSIS — J449 Chronic obstructive pulmonary disease, unspecified: Secondary | ICD-10-CM | POA: Insufficient documentation

## 2023-02-20 DIAGNOSIS — N320 Bladder-neck obstruction: Secondary | ICD-10-CM | POA: Insufficient documentation

## 2023-02-20 DIAGNOSIS — E291 Testicular hypofunction: Secondary | ICD-10-CM | POA: Insufficient documentation

## 2023-02-20 DIAGNOSIS — S90851A Superficial foreign body, right foot, initial encounter: Secondary | ICD-10-CM

## 2023-02-20 NOTE — Progress Notes (Signed)
Subjective:  Patient ID: Aaron Graham., male    DOB: 01-22-1946,  MRN: [id number] HPI Chief Complaint  Patient presents with   Foot Pain    Plantar forefoot right - stepped on a piece of glass 6 weeks ago, tried to dig it out, soaks-no help   New Patient (Initial Visit)    77 y.o. male presents with the above complaint.   ROS: Denies fever chills nausea vomit muscle aches pains calf pain back pain chest pain shortness of breath.  Past Medical History:  Diagnosis Date   Bladder outlet obstruction    COPD (chronic obstructive pulmonary disease)    Dysplastic nevus 04/03/2017   Left lat. infrapectoral. Mild atypia, limited margins free.   History of colon polyps    History of hyperglycemia    Hyperlipidemia    Hypogonadism in male    Vitamin D deficiency    Past Surgical History:  Procedure Laterality Date   ACHILLES TENDON REPAIR  1990   COLONOSCOPY  07/05/2008   COLONOSCOPY  09/07/2013   COLONOSCOPY WITH PROPOFOL N/A 11/03/2018   Procedure: COLONOSCOPY WITH PROPOFOL;  Surgeon: Scot Jun, MD;  Location: [facility] ENDOSCOPY;  Service: Endoscopy;  Laterality: N/A;   TONSILLECTOMY      Current Outpatient Medications:    atorvastatin (LIPITOR) 40 MG tablet, Take 40 mg by mouth daily., Disp: , Rfl:    cabergoline (DOSTINEX) 0.5 MG tablet, Take 0.25 mg by mouth once a week., Disp: , Rfl:    Cholecalciferol (VITAMIN D3 PO), Take 2,000 Units by mouth., Disp: , Rfl:    diazepam (VALIUM) 5 MG tablet, Take 2.5 mg by mouth daily. , Disp: , Rfl:    sertraline (ZOLOFT) 50 MG tablet, Take 50 mg by mouth daily., Disp: , Rfl:    sildenafil (VIAGRA) 100 MG tablet, Take 100 mg by mouth as needed for erectile dysfunction., Disp: , Rfl:    tamsulosin (FLOMAX) 0.4 MG CAPS capsule, Take 0.4 mg by mouth., Disp: , Rfl:    testosterone cypionate (DEPOTESTOTERONE CYPIONATE) 100 MG/ML injection, Inject into the muscle every 7 (seven) days. For IM use only, Disp: , Rfl:    zolpidem (AMBIEN)  10 MG tablet, Take 10 mg by mouth as needed for sleep., Disp: , Rfl:   Allergies  Allergen Reactions   Phentermine    Sibutramine    Review of Systems Objective:  There were no vitals filed for this visit.  General: Well developed, nourished, in no acute distress, alert and oriented x3   Dermatological: Skin is warm, dry and supple bilateral. Nails x 10 are well maintained; remaining integument appears unremarkable at this time. There are no open sores, no preulcerative lesions, no rash or signs of infection present.  There is a small wound to the plantar aspect of the right foot just beneath the second metatarsal head.  The area was debrided today noting that there was a foreign body present I was able to remove the foreign body in total.  There is no purulence no malodor no signs of infection.  This was a small piece of an colored glass.  Vascular: Dorsalis Pedis artery and Posterior Tibial artery pedal pulses are 2/4 bilateral with immedate capillary fill time. Pedal hair growth present. No varicosities and no lower extremity edema present bilateral.   Neruologic: Grossly intact via light touch bilateral. Vibratory intact via tuning fork bilateral. Protective threshold with Semmes Wienstein monofilament intact to all pedal sites bilateral. Patellar and Achilles deep tendon reflexes 2+  bilateral. No Babinski or clonus noted bilateral.   Musculoskeletal: No gross boney pedal deformities bilateral. No pain, crepitus, or limitation noted with foot and ankle range of motion bilateral. Muscular strength 5/5 in all groups tested bilateral.  Gait: Unassisted, Nonantalgic.    Radiographs:  Radiographs taken today demonstrate marker to the forefoot with a small piece of opaque glass.  No soft tissue edema no other foreign bodies noted and no gas to the tissues.  Assessment & Plan:   Assessment: Foreign body forefoot right plantar aspect  Plan: After Betadine skin prep small portion of glass  was removed to render the patient asymptomatic.  This was after I debrided the superficial skin and then was able to feel the small piece of glass.  No local anesthetic was necessary other than some cold spray.     Hjalmar Ballengee T. [place], [place]

## 2023-09-25 ENCOUNTER — Encounter: Payer: Self-pay | Admitting: Dermatology

## 2023-09-25 ENCOUNTER — Ambulatory Visit: Payer: Medicare Other | Admitting: Dermatology

## 2023-09-25 DIAGNOSIS — Z1283 Encounter for screening for malignant neoplasm of skin: Secondary | ICD-10-CM

## 2023-09-25 DIAGNOSIS — L578 Other skin changes due to chronic exposure to nonionizing radiation: Secondary | ICD-10-CM | POA: Diagnosis not present

## 2023-09-25 DIAGNOSIS — D229 Melanocytic nevi, unspecified: Secondary | ICD-10-CM

## 2023-09-25 DIAGNOSIS — L814 Other melanin hyperpigmentation: Secondary | ICD-10-CM

## 2023-09-25 DIAGNOSIS — W908XXA Exposure to other nonionizing radiation, initial encounter: Secondary | ICD-10-CM

## 2023-09-25 DIAGNOSIS — L570 Actinic keratosis: Secondary | ICD-10-CM

## 2023-09-25 DIAGNOSIS — Z79899 Other long term (current) drug therapy: Secondary | ICD-10-CM

## 2023-09-25 DIAGNOSIS — Z5111 Encounter for antineoplastic chemotherapy: Secondary | ICD-10-CM

## 2023-09-25 DIAGNOSIS — L820 Inflamed seborrheic keratosis: Secondary | ICD-10-CM

## 2023-09-25 DIAGNOSIS — Z7189 Other specified counseling: Secondary | ICD-10-CM

## 2023-09-25 DIAGNOSIS — D1801 Hemangioma of skin and subcutaneous tissue: Secondary | ICD-10-CM

## 2023-09-25 DIAGNOSIS — Z86018 Personal history of other benign neoplasm: Secondary | ICD-10-CM

## 2023-09-25 DIAGNOSIS — Z872 Personal history of diseases of the skin and subcutaneous tissue: Secondary | ICD-10-CM

## 2023-09-25 DIAGNOSIS — L821 Other seborrheic keratosis: Secondary | ICD-10-CM

## 2023-09-25 MED ORDER — FLUOROURACIL 5 % EX CREA: TOPICAL_CREAM | CUTANEOUS | 2 refills | Status: AC

## 2023-09-25 NOTE — Patient Instructions (Signed)
Cryotherapy Aftercare  Wash gently with soap and water everyday.   Apply Vaseline and Band-Aid daily until healed.   **Start after November 06, 2023.** - Start 5-fluorouracil/calcipotriene cream twice a day for 5 days to affected areas including forehead and temples. Prescription sent to [facility]. Patient advised they will receive an email to purchase the medication online and have it sent to their home. Patient provided with handout reviewing treatment course and side effects and advised to call or message [place] on MyChart with any concerns.  Instructions for [facility] Medications  One or more of your medications was sent to the [facility] mail order compounding pharmacy. You will receive an email or phone call from them and can purchase the medicine through that link. It will then be mailed to your home at the address you confirmed. If for any reason you do not receive an email from them, please check your spam folder. If you still do not find the email, please let us know. [facility] phone number is [phone].   Reviewed course of treatment and expected reaction.  Patient advised to expect inflammation and crusting and advised that erosions are possible.  Patient advised to be diligent with sun protection during and after treatment. Counseled to keep medication out of reach of children and pets.    5-fluorouracil/calcipotriene cream is is a type of field treatment used to treat precancers, thin skin cancers, and areas of sun damage. Expected reaction includes irritation and mild inflammation potentially progressing to more severe inflammation including redness, scaling, crusting and open sores/erosions.  If too much irritation occurs, ensure application of only a thin layer and decrease frequency of use to achieve a tolerable level of inflammation. Recommend applying Vaseline ointment to open sores as needed.  Minimize sun exposure while under treatment.  Recommend daily broad spectrum sunscreen SPF 30+ to sun-exposed areas, reapply every 2 hours as needed.          Recommend daily broad spectrum sunscreen SPF 30+ to sun-exposed areas, reapply every 2 hours as needed. Call for new or changing lesions.  Staying in the shade or wearing long sleeves, sun glasses (UVA+UVB protection) and wide brim hats (4-inch brim around the entire circumference of the hat) are also recommended for sun protection.      Melanoma ABCDEs  Melanoma is the most dangerous type of skin cancer, and is the leading cause of death from skin disease.  You are more likely to develop melanoma if you: Have light-colored skin, light-colored eyes, or red or blond hair Spend a lot of time in the sun Tan regularly, either outdoors or in a tanning bed Have had blistering sunburns, especially during childhood Have a close family member who has had a melanoma Have atypical moles or large birthmarks  Early detection of melanoma is key since treatment is typically straightforward and cure rates are extremely high if we catch it early.   The first sign of melanoma is often a change in a mole or a new dark spot.  The ABCDE system is a way of remembering the signs of melanoma.  A for asymmetry:  The two halves do not match. B for border:  The edges of the growth are irregular. C for color:  A mixture of colors are present instead of an even brown color. D for diameter:  Melanomas are usually (but not always) greater than 6mm - the size of a pencil eraser. E for evolution:  The spot keeps changing  in size, shape, and color.  Please check your skin once per month between visits. You can use a small mirror in front and a large mirror behind you to keep an eye on the back side or your body.   If you see any new or changing lesions before your next follow-up, please call to schedule a visit.  Please continue daily skin protection including broad spectrum sunscreen SPF 30+ to  sun-exposed areas, reapplying every 2 hours as needed when you're outdoors.   Staying in the shade or wearing long sleeves, sun glasses (UVA+UVB protection) and wide brim hats (4-inch brim around the entire circumference of the hat) are also recommended for sun protection.      Due to recent changes in healthcare laws, you may see results of your pathology and/or laboratory studies on MyChart before the doctors have had a chance to review them. We understand that in some cases there may be results that are confusing or concerning to you. Please understand that not all results are received at the same time and often the doctors may need to interpret multiple results in order to provide you with the best plan of care or course of treatment. Therefore, we ask that you please give [place] 2 business days to thoroughly review all your results before contacting the office for clarification. Should we see a critical lab result, you will be contacted sooner.   If You Need Anything After Your Visit  If you have any questions or concerns for your doctor, please call our main line at [phone] and press option 4 to reach your doctor's medical assistant. If no one answers, please leave a voicemail as directed and we will return your call as soon as possible. Messages left after 4 pm will be answered the following business day.   You may also send [place] a message via MyChart. We typically respond to MyChart messages within 1-2 business days.  For prescription refills, please ask your pharmacy to contact our office. Our fax number is [phone].  If you have an urgent issue when the clinic is closed that cannot wait until the next business day, you can page your doctor at the number below.    Please note that while we do our best to be available for urgent issues outside of office hours, we are not available 24/7.   If you have an urgent issue and are unable to reach [place], you may choose to seek medical care at  your doctor's office, retail clinic, urgent care center, or emergency room.  If you have a medical emergency, please immediately call 911 or go to the emergency department.  Pager Numbers  - Dr. Gwen Pounds: [phone]  - Dr. Roseanne Reno: [phone]  - Dr. Katrinka Blazing: [phone]   In the event of inclement weather, please call our main line at [phone] for an update on the status of any delays or closures.  Dermatology Medication Tips: Please keep the boxes that topical medications come in in order to help keep track of the instructions about where and how to use these. Pharmacies typically print the medication instructions only on the boxes and not directly on the medication tubes.   If your medication is too expensive, please contact our office at [phone] option 4 or send [place] a message through MyChart.   We are unable to tell what your co-pay for medications will be in advance as this is different depending on your insurance coverage. However, we may be able to find a  substitute medication at lower cost or fill out paperwork to get insurance to cover a needed medication.   If a prior authorization is required to get your medication covered by your insurance company, please allow [place] 1-2 business days to complete this process.  Drug prices often vary depending on where the prescription is filled and some pharmacies may offer cheaper prices.  The website www.goodrx.com contains coupons for medications through different pharmacies. The prices here do not account for what the cost may be with help from insurance (it may be cheaper with your insurance), but the website can give you the price if you did not use any insurance.  - You can print the associated coupon and take it with your prescription to the pharmacy.  - You may also stop by our office during regular business hours and pick up a GoodRx coupon card.  - If you need your prescription sent electronically to a different pharmacy,  notify our office through [facility] or by phone at [phone] option 4.     Si Usted Necesita Algo Despus de Su Visita  Tambin puede enviarnos un mensaje a travs de Clinical cytogeneticist. Por lo general respondemos a los mensajes de MyChart en el transcurso de 1 a 2 das hbiles.  Para renovar recetas, por favor pida a su farmacia que se ponga en contacto con nuestra oficina. Annie Sable de fax es [place] [phone].  Si tiene un asunto urgente cuando la clnica est cerrada y que no puede esperar hasta el siguiente da hbil, puede llamar/localizar a su doctor(a) al nmero que aparece a continuacin.   Por favor, tenga en cuenta que aunque hacemos todo lo posible para estar disponibles para asuntos urgentes fuera del horario de [place], no estamos disponibles las 24 horas del da, los 7 [address]  [address] de la [place].   Si tiene un problema urgente y no puede comunicarse con nosotros, puede optar por buscar atencin mdica  en el consultorio de su doctor(a), en una clnica privada, en un centro de atencin urgente o en una sala de emergencias.  Si tiene Engineer, drilling, por favor llame inmediatamente al 911 o vaya a la sala de emergencias.  Nmeros de bper  - Dr. Gwen Pounds: [phone]  - Dra. Roseanne Reno: [phone]  - Dr. Katrinka Blazing: [phone]   En caso de inclemencias del tiempo, por favor llame a Lacy Duverney principal al [phone] para una actualizacin sobre el [place] de cualquier retraso o cierre.  Consejos para la medicacin en dermatologa: Por favor, guarde las cajas en las que vienen los medicamentos de uso tpico para ayudarle a seguir las instrucciones sobre dnde y cmo usarlos. Las farmacias generalmente imprimen las instrucciones del medicamento slo en las cajas y no directamente en los tubos del [place].   Si su medicamento es muy caro, por favor, pngase en contacto con Rolm Gala llamando al [phone] y presione la opcin 4 o envenos un mensaje a travs de  Clinical cytogeneticist.   No podemos decirle cul ser su copago por los medicamentos por adelantado ya que esto es diferente dependiendo de la cobertura de su seguro. Sin embargo, es posible que podamos encontrar un medicamento sustituto a Audiological scientist un formulario para que el seguro cubra el medicamento que se considera necesario.   Si se requiere una autorizacin previa para que su compaa de seguros [place] su medicamento, por favor permtanos de 1 a 2 das hbiles para completar [address]  Los precios de los medicamentos varan con frecuencia dependiendo del  lugar de dnde se surte la receta y alguna farmacias pueden ofrecer precios ms baratos.  El sitio web www.goodrx.com tiene cupones para medicamentos de Health and safety inspector. Los precios aqu no tienen en cuenta lo que podra costar con la ayuda del seguro (puede ser ms barato con su seguro), pero el sitio web puede darle el precio si no utiliz Tourist information centre manager.  - Puede imprimir el cupn correspondiente y llevarlo con su receta a la farmacia.  - Tambin puede pasar por nuestra oficina durante el horario de atencin regular y Education officer, museum una tarjeta de cupones de GoodRx.  - Si necesita que su receta se enve electrnicamente a una farmacia diferente, informe a nuestra oficina a travs de MyChart de [place] o por telfono llamando al [phone] y presione la opcin 4.

## 2023-09-25 NOTE — Progress Notes (Signed)
Follow-Up Visit   Subjective  Aaron Graham. is a 77 y.o. male who presents for the following: Skin Cancer Screening and Full Body Skin Exam. Hx of dysplastic nevus. Hx of actinic keratoses. No personal hx of skin cancer.   The patient presents for Total-Body Skin Exam (TBSE) for skin cancer screening and mole check. The patient has spots, moles and lesions to be evaluated, some may be new or changing and the patient may have concern these could be cancer.    The following portions of the chart were reviewed this encounter and updated as appropriate: medications, allergies, medical history  Review of Systems:  No other skin or systemic complaints except as noted in HPI or Assessment and Plan.  Objective  Well appearing patient in no apparent distress; mood and affect are within normal limits.  A full examination was performed including scalp, head, eyes, ears, nose, lips, neck, chest, axillae, abdomen, back, buttocks, bilateral upper extremities, bilateral lower extremities, hands, feet, fingers, toes, fingernails, and toenails. All findings within normal limits unless otherwise noted below.   Relevant physical exam findings are noted in the Assessment and Plan.  face x3, scalp x3 (6) Erythematous thin papules/macules with gritty scale.   Right Forearm - Posterior x1 Erythematous keratotic or waxy stuck-on papule or plaque.    Assessment & Plan   HISTORY OF DYSPLASTIC NEVUS. Left lateral infra-pectoral. Mild atypia. 04/03/2017. No evidence of recurrence today Recommend regular full body skin exams Recommend daily broad spectrum sunscreen SPF 30+ to sun-exposed areas, reapply every 2 hours as needed.  Call if any new or changing lesions are noted between office visits   SKIN CANCER SCREENING PERFORMED TODAY.  ACTINIC DAMAGE WITH PRECANCEROUS ACTINIC KERATOSES Counseling for Topical Chemotherapy Management: Patient exhibits: - Severe, confluent actinic changes with  pre-cancerous actinic keratoses that is secondary to cumulative UV radiation exposure over time - Condition that is severe; chronic, not at goal. - diffuse scaly erythematous macules and papules with underlying dyspigmentation - Discussed Prescription "Field Treatment" topical Chemotherapy for Severe, Chronic Confluent Actinic Changes with Pre-Cancerous Actinic Keratoses Field treatment involves treatment of an entire area of skin that has confluent Actinic Changes (Sun/ Ultraviolet light damage) and PreCancerous Actinic Keratoses by method of PhotoDynamic Therapy (PDT) and/or prescription Topical Chemotherapy agents such as 5-fluorouracil, 5-fluorouracil/calcipotriene, and/or imiquimod.  The purpose is to decrease the number of clinically evident and subclinical PreCancerous lesions to prevent progression to development of skin cancer by chemically destroying early precancer changes that may or may not be visible.  It has been shown to reduce the risk of developing skin cancer in the treated area. As a result of treatment, redness, scaling, crusting, and open sores may occur during treatment course. One or more than one of these methods may be used and may have to be used several times to control, suppress and eliminate the PreCancerous changes. Discussed treatment course, expected reaction, and possible side effects. - Recommend daily broad spectrum sunscreen SPF 30+ to sun-exposed areas, reapply every 2 hours as needed.  - Staying in the shade or wearing long sleeves, sun glasses (UVA+UVB protection) and wide brim hats (4-inch brim around the entire circumference of the hat) are also recommended. - Call for new or changing lesions.   Start after November 06, 2023. - Start 5-fluorouracil/calcipotriene cream twice a day for 5 days to affected areas including forehead and temples. Prescription sent to [facility]. Patient advised they will receive an email to purchase the  medication  online and have it sent to their home. Patient provided with handout reviewing treatment course and side effects and advised to call or message [place] on MyChart with any concerns.  Reviewed course of treatment and expected reaction.  Patient advised to expect inflammation and crusting and advised that erosions are possible.  Patient advised to be diligent with sun protection during and after treatment. Counseled to keep medication out of reach of children and pets.    LENTIGINES, SEBORRHEIC KERATOSES, HEMANGIOMAS - Benign normal skin lesions - Benign-appearing - Call for any changes  MELANOCYTIC NEVI - Tan-brown and/or pink-flesh-colored symmetric macules and papules - Benign appearing on exam today - Observation - Call clinic for new or changing moles - Recommend daily use of broad spectrum spf 30+ sunscreen to sun-exposed areas.   HEMANGIOMA Exam: red papule at left infra-pectoral without features suspicious for malignancy on dermoscopy  Discussed benign nature. Recommend observation. Call for changes.     AK (actinic keratosis) (6) face x3, scalp x3  Actinic keratoses are precancerous spots that appear secondary to cumulative UV radiation exposure/sun exposure over time. They are chronic with expected duration over 1 year. A portion of actinic keratoses will progress to squamous cell carcinoma of the skin. It is not possible to reliably predict which spots will progress to skin cancer and so treatment is recommended to prevent development of skin cancer.  Recommend daily broad spectrum sunscreen SPF 30+ to sun-exposed areas, reapply every 2 hours as needed.  Recommend staying in the shade or wearing long sleeves, sun glasses (UVA+UVB protection) and wide brim hats (4-inch brim around the entire circumference of the hat). Call for new or changing lesions.  Destruction of lesion - face x3, scalp x3 (6) Complexity: simple   Destruction method: cryotherapy   Informed consent: discussed  and consent obtained   Timeout:  patient name, date of birth, surgical site, and procedure verified Lesion destroyed using liquid nitrogen: Yes   Region frozen until ice ball extended beyond lesion: Yes   Outcome: patient tolerated procedure well with no complications   Post-procedure details: wound care instructions given   Additional details:  Prior to procedure, discussed risks of blister formation, small wound, skin dyspigmentation, or rare scar following cryotherapy. Recommend Vaseline ointment to treated areas while healing.   Inflamed seborrheic keratosis Right Forearm - Posterior x1  Symptomatic, irritating, patient would like treated.  Destruction of lesion - Right Forearm - Posterior x1 Complexity: simple   Destruction method: cryotherapy   Informed consent: discussed and consent obtained   Timeout:  patient name, date of birth, surgical site, and procedure verified Lesion destroyed using liquid nitrogen: Yes   Region frozen until ice ball extended beyond lesion: Yes   Outcome: patient tolerated procedure well with no complications   Post-procedure details: wound care instructions given   Additional details:  Prior to procedure, discussed risks of blister formation, small wound, skin dyspigmentation, or rare scar following cryotherapy. Recommend Vaseline ointment to treated areas while healing.    Return in about 1 year (around 09/24/2024) for TBSE, HxDN.  I, Lawson Radar, CMA, am acting as scribe for Armida Sans, MD.   Documentation: I have reviewed the above documentation for accuracy and completeness, and I agree with the above.  Armida Sans, MD

## 2023-10-01 ENCOUNTER — Encounter: Payer: Self-pay | Admitting: Gastroenterology

## 2023-11-01 ENCOUNTER — Encounter: Payer: Self-pay | Admitting: Gastroenterology

## 2023-11-04 ENCOUNTER — Encounter: Payer: Self-pay | Admitting: Gastroenterology

## 2023-11-04 ENCOUNTER — Ambulatory Visit: Payer: Medicare Other | Admitting: Registered Nurse

## 2023-11-04 ENCOUNTER — Ambulatory Visit
Admission: RE | Admit: 2023-11-04 | Discharge: 2023-11-04 | Disposition: A | Discharge status: Home / Self Care | Payer: Medicare Other | Attending: Gastroenterology | Admitting: Gastroenterology

## 2023-11-04 ENCOUNTER — Encounter
Admission: RE | Disposition: A | Discharge status: Home / Self Care | Payer: Self-pay | Source: Home / Self Care | Attending: Gastroenterology

## 2023-11-04 ENCOUNTER — Other Ambulatory Visit: Payer: Self-pay

## 2023-11-04 DIAGNOSIS — D120 Benign neoplasm of cecum: Secondary | ICD-10-CM | POA: Insufficient documentation

## 2023-11-04 DIAGNOSIS — K5730 Diverticulosis of large intestine without perforation or abscess without bleeding: Secondary | ICD-10-CM | POA: Diagnosis not present

## 2023-11-04 DIAGNOSIS — Z1211 Encounter for screening for malignant neoplasm of colon: Secondary | ICD-10-CM | POA: Insufficient documentation

## 2023-11-04 DIAGNOSIS — D123 Benign neoplasm of transverse colon: Secondary | ICD-10-CM | POA: Diagnosis not present

## 2023-11-04 DIAGNOSIS — J449 Chronic obstructive pulmonary disease, unspecified: Secondary | ICD-10-CM | POA: Diagnosis not present

## 2023-11-04 DIAGNOSIS — K640 First degree hemorrhoids: Secondary | ICD-10-CM | POA: Diagnosis not present

## 2023-11-04 DIAGNOSIS — Z87891 Personal history of nicotine dependence: Secondary | ICD-10-CM | POA: Diagnosis not present

## 2023-11-04 DIAGNOSIS — Z8673 Personal history of transient ischemic attack (TIA), and cerebral infarction without residual deficits: Secondary | ICD-10-CM | POA: Diagnosis not present

## 2023-11-04 HISTORY — DX: Prediabetes: R73.03

## 2023-11-04 HISTORY — PX: POLYPECTOMY: SHX5525

## 2023-11-04 HISTORY — PX: COLONOSCOPY WITH PROPOFOL: SHX5780

## 2023-11-04 HISTORY — DX: Cerebral infarction, unspecified: I63.9

## 2023-11-04 HISTORY — DX: Arteriovenous malformation, site unspecified: Q27.30

## 2023-11-04 HISTORY — DX: Benign neoplasm of pituitary gland: D35.2

## 2023-11-04 SURGERY — COLONOSCOPY WITH PROPOFOL
Anesthesia: General

## 2023-11-04 MED ORDER — SODIUM CHLORIDE 0.9 % IV SOLN: INTRAVENOUS | Status: DC | PRN

## 2023-11-04 MED ORDER — SODIUM CHLORIDE 0.9 % IV SOLN: INTRAVENOUS | Status: DC

## 2023-11-04 MED ORDER — LIDOCAINE HCL (CARDIAC) PF 100 MG/5ML IV SOSY
PREFILLED_SYRINGE | INTRAVENOUS | Status: DC | PRN
  Administered 2023-11-04: 100 mg via INTRAVENOUS

## 2023-11-04 MED ORDER — PROPOFOL 500 MG/50ML IV EMUL
INTRAVENOUS | Status: DC | PRN
  Administered 2023-11-04: 150 ug/kg/min via INTRAVENOUS

## 2023-11-04 NOTE — Anesthesia Postprocedure Evaluation (Signed)
Anesthesia Post Note  Patient: Aaron Graham.  Procedure(s) Performed: COLONOSCOPY WITH PROPOFOL POLYPECTOMY  Patient location during evaluation: Endoscopy Anesthesia Type: General Level of consciousness: awake and alert Pain management: pain level controlled Vital Signs Assessment: post-procedure vital signs reviewed and stable Respiratory status: spontaneous breathing, nonlabored ventilation, respiratory function stable and patient connected to nasal cannula oxygen Cardiovascular status: blood pressure returned to baseline and stable Postop Assessment: no apparent nausea or vomiting Anesthetic complications: no   No notable events documented.   Last Vitals:  Vitals:   11/04/23 0949 11/04/23 0957  BP: (!) 85/42 116/65  Pulse: (!) 58   Resp: 17   Temp:    SpO2: 94%     Last Pain:  Vitals:   11/04/23 1006  TempSrc:   PainSc: 0-No pain                 Lenard Simmer

## 2023-11-04 NOTE — Op Note (Signed)
[facility] Gastroenterology Patient Name: Aaron Graham Procedure Date: 11/04/2023 8:47 AM MRN: [id number] Account #: [id number] Date of Birth: October 14, 1946 Admit Type: Outpatient Age: 77 Room: [facility] ENDO ROOM 2 Gender: Male Note Status: Finalized Instrument Name: Colonscope [id number] Procedure:             Colonoscopy Indications:           High risk colon cancer surveillance: Personal history                         of colonic polyps Providers:             Jaynie Collins DO, DO Medicines:             Monitored Anesthesia Care Complications:         No immediate complications. Estimated blood loss:                         Minimal. Procedure:             Pre-Anesthesia Assessment:                        - Prior to the procedure, a History and Physical was                         performed, and patient medications and allergies were                         reviewed. The patient is competent. The risks and                         benefits of the procedure and the sedation options and                         risks were discussed with the patient. All questions                         were answered and informed consent was obtained.                         Patient identification and proposed procedure were                         verified by the physician, the nurse, the anesthetist                         and the technician in the endoscopy suite. Mental                         Status Examination: alert and oriented. Airway                         Examination: normal oropharyngeal airway and neck                         mobility. Respiratory Examination: clear to                         auscultation. CV Examination: RRR, no murmurs, no S3  or S4. Prophylactic Antibiotics: The patient does not                         require prophylactic antibiotics. Prior                         Anticoagulants: The patient has taken no anticoagulant                          or antiplatelet agents. ASA Grade Assessment: III - A                         patient with severe systemic disease. After reviewing                         the risks and benefits, the patient was deemed in                         satisfactory condition to undergo the procedure. The                         anesthesia plan was to use monitored anesthesia care                         (MAC). Immediately prior to administration of                         medications, the patient was re-assessed for adequacy                         to receive sedatives. The heart rate, respiratory                         rate, oxygen saturations, blood pressure, adequacy of                         pulmonary ventilation, and response to care were                         monitored throughout the procedure. The physical                         status of the patient was re-assessed after the                         procedure.                        After obtaining informed consent, the colonoscope was                         passed under direct vision. Throughout the procedure,                         the patient's blood pressure, pulse, and oxygen                         saturations were monitored continuously. The  Colonoscope was introduced through the anus and                         advanced to the the cecum, identified by appendiceal                         orifice and ileocecal valve. The colonoscopy was                         performed without difficulty. The patient tolerated                         the procedure well. The quality of the bowel                         preparation was evaluated using the BBPS [facility] Bowel                         Preparation Scale) with scores of: Right Colon = 2                         (minor amount of residual staining, small fragments of                         stool and/or opaque liquid, but mucosa seen well),                          Transverse Colon = 2 (minor amount of residual                         staining, small fragments of stool and/or opaque                         liquid, but mucosa seen well) and Left Colon = 2                         (minor amount of residual staining, small fragments of                         stool and/or opaque liquid, but mucosa seen well). The                         total BBPS score equals 6. The quality of the bowel                         preparation was good. The ileocecal valve, appendiceal                         orifice, and rectum were photographed. Findings:      The perianal and digital rectal examinations were normal. Pertinent       negatives include normal sphincter tone.      Two sessile polyps were found in the cecum. The polyps were 1 to 2 mm in       size. These polyps were removed with a jumbo cold forceps. Resection and       retrieval were complete. Estimated blood loss was minimal.  Two sessile polyps were found in the distal transverse colon. The polyps       were 4 to 12 mm in size. These polyps were removed with a cold snare.       Resection and retrieval were complete. Estimated blood loss was minimal.      Multiple small-mouthed diverticula were found in the recto-sigmoid       colon. Estimated blood loss: none.      Non-bleeding internal hemorrhoids were found during retroflexion. The       hemorrhoids were Grade I (internal hemorrhoids that do not prolapse).       Estimated blood loss: none.      The exam was otherwise without abnormality on direct and retroflexion       views. Impression:            - Two 1 to 2 mm polyps in the cecum, removed with a                         jumbo cold forceps. Resected and retrieved.                        - Two 4 to 12 mm polyps in the distal transverse                         colon, removed with a cold snare. Resected and                         retrieved.                        - Diverticulosis in the  recto-sigmoid colon.                        - Non-bleeding internal hemorrhoids.                        - The examination was otherwise normal on direct and                         retroflexion views. Recommendation:        - Patient has a contact number available for                         emergencies. The signs and symptoms of potential                         delayed complications were discussed with the patient.                         Return to normal activities tomorrow. Written                         discharge instructions were provided to the patient.                        - Discharge patient to home.                        - Resume previous diet.                        -  Continue present medications.                        - Await pathology results.                        - Repeat colonoscopy for surveillance based on                         pathology results.                        - No aspirin, ibuprofen, naproxen, or other                         non-steroidal anti-inflammatory drugs for 3 days after                         polyp removal.                        - Return to referring physician as previously                         scheduled.                        - The findings and recommendations were discussed with                         the patient. Procedure Code(s):     --- Professional ---                        [phone], Colonoscopy, flexible; with removal of                         tumor(s), polyp(s), or other lesion(s) by snare                         technique                        45380, 59, Colonoscopy, flexible; with biopsy, single                         or multiple Diagnosis Code(s):     --- Professional ---                        Z86.010, Personal history of colonic polyps                        D12.0, Benign neoplasm of cecum                        D12.3, Benign neoplasm of transverse colon (hepatic                         flexure or splenic flexure)                         K64.0, First degree hemorrhoids  K57.30, Diverticulosis of large intestine without                         perforation or abscess without bleeding CPT copyright 2022 American Medical Association. All rights reserved. The codes documented in this report are preliminary and upon coder review may  be revised to meet current compliance requirements. Attending Participation:      I personally performed the entire procedure. Elfredia Nevins, DO Jaynie Collins DO, DO 11/04/2023 9:51:08 AM This report has been signed electronically. Number of Addenda: 0 Note Initiated On: 11/04/2023 8:47 AM Scope Withdrawal Time: 0 hours 21 minutes 50 seconds  Total Procedure Duration: 0 hours 24 minutes 38 seconds  Estimated Blood Loss:  Estimated blood loss was minimal.      [facility]

## 2023-11-04 NOTE — Interval H&P Note (Signed)
History and Physical Interval Note: Preprocedure H&P from 11/04/23  was reviewed and there was no interval change after seeing and examining the patient.  Written consent was obtained from the patient after discussion of risks, benefits, and alternatives. Patient has consented to proceed with Colonoscopy with possible intervention   11/04/2023 9:08 AM  Aaron Graham.  has presented today for surgery, with the diagnosis of Z86.0101 (ICD-10-CM) - Hx of adenomatous colonic polyps.  The various methods of treatment have been discussed with the patient and family. After consideration of risks, benefits and other options for treatment, the patient has consented to  Procedure(s): COLONOSCOPY WITH PROPOFOL (N/A) as a surgical intervention.  The patient's history has been reviewed, patient examined, no change in status, stable for surgery.  I have reviewed the patient's chart and labs.  Questions were answered to the patient's satisfaction.     Jaynie Collins

## 2023-11-04 NOTE — Transfer of Care (Signed)
Immediate Anesthesia Transfer of Care Note  Patient: Aaron Graham.  Procedure(s) Performed: COLONOSCOPY WITH PROPOFOL POLYPECTOMY  Patient Location: PACU  Anesthesia Type:General  Level of Consciousness: sedated  Airway & Oxygen Therapy: Patient Spontanous Breathing  Post-op Assessment: Report given to RN and Post -op Vital signs reviewed and stable  Post vital signs: Reviewed and stable  Last Vitals:  Vitals Value Taken Time  BP 85/42 11/04/23 0949  Temp    Pulse 58 11/04/23 0949  Resp 17 11/04/23 0949  SpO2 94 % 11/04/23 0949    Last Pain:  Vitals:   11/04/23 0854  TempSrc: Temporal  PainSc: 0-No pain         Complications: No notable events documented.

## 2023-11-04 NOTE — Anesthesia Preprocedure Evaluation (Signed)
Anesthesia Evaluation  Patient identified by MRN, date of birth, ID band Patient awake    Reviewed: Allergy & Precautions, H&P , NPO status , Patient's Chart, lab work & pertinent test results, reviewed documented beta blocker date and time   History of Anesthesia Complications Negative for: history of anesthetic complications  Airway Mallampati: III  TM Distance: >3 FB Neck ROM: full    Dental  (+) Dental Advidsory Given, Caps, Teeth Intact   Pulmonary neg shortness of breath, COPD, Recent URI , former smoker          Cardiovascular Exercise Tolerance: Good negative cardio ROS      Neuro/Psych neg Seizures TIA negative psych ROS   GI/Hepatic negative GI ROS, Neg liver ROS,,,  Endo/Other  negative endocrine ROS    Renal/GU negative Renal ROS  negative genitourinary   Musculoskeletal   Abdominal   Peds  Hematology negative hematology ROS (+)   Anesthesia Other Findings Past Medical History: No date: Bladder outlet obstruction No date: COPD (chronic obstructive pulmonary disease) (HCC) No date: History of colon polyps No date: History of hyperglycemia No date: Hyperlipidemia No date: Hypogonadism in male No date: Vitamin D deficiency   Reproductive/Obstetrics negative OB ROS                             Anesthesia Physical Anesthesia Plan  ASA: 2  Anesthesia Plan: General   Post-op Pain Management:    Induction: Intravenous  PONV Risk Score and Plan: 2 and Propofol infusion and TIVA  Airway Management Planned: Natural Airway and Nasal Cannula  Additional Equipment:   Intra-op Plan:   Post-operative Plan:   Informed Consent: I have reviewed the patients History and Physical, chart, labs and discussed the procedure including the risks, benefits and alternatives for the proposed anesthesia with the patient or authorized representative who has indicated his/her understanding  and acceptance.     Dental Advisory Given  Plan Discussed with: Anesthesiologist, CRNA and Surgeon  Anesthesia Plan Comments:         Anesthesia Quick Evaluation

## 2023-11-04 NOTE — H&P (Signed)
Pre-Procedure H&P   Patient ID: Aaron Graham. is a 77 y.o. male.  Gastroenterology Provider: Jaynie Collins, DO  Referring Provider: Fransico Setters, NP PCP: Lynnea Ferrier, MD  Date: 11/04/2023  HPI Aaron Graham. is a 77 y.o. male who presents today for Colonoscopy for Personal history of colon polyps .  Last underwent colonoscopy 10/2018- IH and hyperplastic polyps  09/2013- IH 06/2008- 1 TA  No fhx colon cancer or polyps  CVA earlier this year   Past Medical History:  Diagnosis Date   Adenoma of pituitary (HCC)    AVM (arteriovenous malformation)    Bladder outlet obstruction    Cerebrovascular accident (CVA) (HCC)    COPD (chronic obstructive pulmonary disease) (HCC)    Dysplastic nevus 04/03/2017   Left lat. infrapectoral. Mild atypia, limited margins free.   History of colon polyps    History of hyperglycemia    Hyperlipidemia    Hypogonadism in male    Pre-diabetes    Stroke [facility])    Vitamin D deficiency     Past Surgical History:  Procedure Laterality Date   ACHILLES TENDON REPAIR  1990   COLONOSCOPY  07/05/2008   COLONOSCOPY  09/07/2013   COLONOSCOPY WITH PROPOFOL N/A 11/03/2018   Procedure: COLONOSCOPY WITH PROPOFOL;  Surgeon: Scot Jun, MD;  Location: [facility] ENDOSCOPY;  Service: Endoscopy;  Laterality: N/A;   TONSILLECTOMY      Family History No h/o GI disease or malignancy  Review of Systems  Constitutional:  Negative for activity change, appetite change, chills, diaphoresis, fatigue, fever and unexpected weight change.  HENT:  Negative for trouble swallowing and voice change.   Respiratory:  Negative for shortness of breath and wheezing.   Cardiovascular:  Negative for chest pain, palpitations and leg swelling.  Gastrointestinal:  Negative for abdominal distention, abdominal pain, anal bleeding, blood in stool, constipation, diarrhea, nausea and vomiting.  Musculoskeletal:  Negative for arthralgias and myalgias.   Skin:  Negative for color change and pallor.  Neurological:  Negative for dizziness, syncope and weakness.  Psychiatric/Behavioral:  Negative for confusion. The patient is not nervous/anxious.   All other systems reviewed and are negative.    Medications No current facility-administered medications on file prior to encounter.   Current Outpatient Medications on File Prior to Encounter  Medication Sig Dispense Refill   aspirin EC 81 MG tablet Take 81 mg by mouth daily. Swallow whole.     atorvastatin (LIPITOR) 40 MG tablet Take 40 mg by mouth daily.     Cholecalciferol (VITAMIN D3 PO) Take 2,000 Units by mouth.     diazepam (VALIUM) 5 MG tablet Take 2.5 mg by mouth daily.      sertraline (ZOLOFT) 50 MG tablet Take 50 mg by mouth daily.     tamsulosin (FLOMAX) 0.4 MG CAPS capsule Take 0.4 mg by mouth.     cabergoline (DOSTINEX) 0.5 MG tablet Take 0.25 mg by mouth once a week.     sildenafil (VIAGRA) 100 MG tablet Take 100 mg by mouth as needed for erectile dysfunction.     testosterone cypionate (DEPOTESTOTERONE CYPIONATE) 100 MG/ML injection Inject into the muscle every 7 (seven) days. For IM use only     zolpidem (AMBIEN) 10 MG tablet Take 10 mg by mouth as needed for sleep.      Pertinent medications related to GI and procedure were reviewed by me with the patient prior to the procedure  No current facility-administered medications for  this encounter.      Allergies  Allergen Reactions   Phentermine    Sibutramine    Allergies were reviewed by me prior to the procedure  Objective   Body mass index is 28.56 kg/m. Vitals:   11/04/23 0854  BP: (!) 155/88  Pulse: 69  Resp: 16  Temp: (!) 96.9 F (36.1 C)  TempSrc: Temporal  SpO2: 96%  Weight: 87.7 kg  Height: 5\' 9"  (1.753 m)     Physical Exam Vitals and nursing note reviewed.  Constitutional:      General: He is not in acute distress.    Appearance: Normal appearance. He is not ill-appearing, toxic-appearing or  diaphoretic.  HENT:     Head: Normocephalic and atraumatic.     Nose: Nose normal.     Mouth/Throat:     Mouth: Mucous membranes are moist.     Pharynx: Oropharynx is clear.  Eyes:     General: No scleral icterus.    Extraocular Movements: Extraocular movements intact.  Cardiovascular:     Rate and Rhythm: Normal rate and regular rhythm.     Heart sounds: Normal heart sounds. No murmur heard.    No friction rub. No gallop.  Pulmonary:     Effort: Pulmonary effort is normal. No respiratory distress.     Breath sounds: Normal breath sounds. No wheezing, rhonchi or rales.  Abdominal:     General: Bowel sounds are normal. There is no distension.     Palpations: Abdomen is soft.     Tenderness: There is no abdominal tenderness. There is no guarding or rebound.  Musculoskeletal:     Cervical back: Neck supple.     Right lower leg: No edema.     Left lower leg: No edema.  Skin:    General: Skin is warm and dry.     Coloration: Skin is not jaundiced or pale.  Neurological:     General: No focal deficit present.     Mental Status: He is alert and oriented to person, place, and time. Mental status is at baseline.  Psychiatric:        Mood and Affect: Mood normal.        Behavior: Behavior normal.        Thought Content: Thought content normal.        Judgment: Judgment normal.      Assessment:  Mr. Aaron Graham. is a 77 y.o. male  who presents today for Colonoscopy for Personal history of colon polyps .  Plan:  Colonoscopy with possible intervention today  Colonoscopy with possible biopsy, control of bleeding, polypectomy, and interventions as necessary has been discussed with the patient/patient representative. Informed consent was obtained from the patient/patient representative after explaining the indication, nature, and risks of the procedure including but not limited to death, bleeding, perforation, missed neoplasm/lesions, cardiorespiratory compromise, and reaction  to medications. Opportunity for questions was given and appropriate answers were provided. Patient/patient representative has verbalized understanding is amenable to undergoing the procedure.   Jaynie Collins, DO  [facility] Gastroenterology  Portions of the record may have been created with voice recognition software. Occasional wrong-word or 'sound-a-like' substitutions may have occurred due to the inherent limitations of voice recognition software.  Read the chart carefully and recognize, using context, where substitutions may have occurred.

## 2023-11-05 ENCOUNTER — Encounter: Payer: Self-pay | Admitting: Gastroenterology

## 2023-11-10 LAB — SURGICAL PATHOLOGY

## 2024-09-24 ENCOUNTER — Ambulatory Visit: Payer: BLUE CROSS/BLUE SHIELD | Admitting: Dermatology

## 2024-09-24 DIAGNOSIS — L814 Other melanin hyperpigmentation: Secondary | ICD-10-CM | POA: Diagnosis not present

## 2024-09-24 DIAGNOSIS — L578 Other skin changes due to chronic exposure to nonionizing radiation: Secondary | ICD-10-CM

## 2024-09-24 DIAGNOSIS — Z1283 Encounter for screening for malignant neoplasm of skin: Secondary | ICD-10-CM

## 2024-09-24 DIAGNOSIS — Z86018 Personal history of other benign neoplasm: Secondary | ICD-10-CM

## 2024-09-24 DIAGNOSIS — D229 Melanocytic nevi, unspecified: Secondary | ICD-10-CM

## 2024-09-24 DIAGNOSIS — L821 Other seborrheic keratosis: Secondary | ICD-10-CM

## 2024-09-24 DIAGNOSIS — W908XXA Exposure to other nonionizing radiation, initial encounter: Secondary | ICD-10-CM

## 2024-09-24 DIAGNOSIS — D1801 Hemangioma of skin and subcutaneous tissue: Secondary | ICD-10-CM

## 2024-09-24 DIAGNOSIS — L570 Actinic keratosis: Secondary | ICD-10-CM | POA: Diagnosis not present

## 2024-09-24 NOTE — Patient Instructions (Signed)

## 2024-09-24 NOTE — Progress Notes (Signed)
   Follow-Up Visit   Subjective  Aaron Corron Osama Coleson. is a 78 y.o. male who presents for the following: Skin Cancer Screening and Full Body Skin Exam, hx of Dysplastic nevus.   The patient presents for Total-Body Skin Exam (TBSE) for skin cancer screening and mole check. The patient has spots, moles and lesions to be evaluated, some may be new or changing and the patient may have concern these could be cancer.  The following portions of the chart were reviewed this encounter and updated as appropriate: medications, allergies, medical history  Review of Systems:  No other skin or systemic complaints except as noted in HPI or Assessment and Plan.  Objective  Well appearing patient in no apparent distress; mood and affect are within normal limits.  A full examination was performed including scalp, head, eyes, ears, nose, lips, neck, chest, axillae, abdomen, back, buttocks, bilateral upper extremities, bilateral lower extremities, hands, feet, fingers, toes, fingernails, and toenails. All findings within normal limits unless otherwise noted below.   Relevant physical exam findings are noted in the Assessment and Plan.  face, scalp (7) Erythematous thin papules/macules with gritty scale.   Assessment & Plan   SKIN CANCER SCREENING PERFORMED TODAY.  LENTIGINES, SEBORRHEIC KERATOSES, HEMANGIOMAS - Benign normal skin lesions - Benign-appearing - Call for any changes  MELANOCYTIC NEVI - Tan-brown and/or pink-flesh-colored symmetric macules and papules - Benign appearing on exam today - Observation - Call clinic for new or changing moles - Recommend daily use of broad spectrum spf 30+ sunscreen to sun-exposed areas.   HISTORY OF DYSPLASTIC NEVUS. Left lateral infra-pectoral. Mild atypia. 04/03/2017. No evidence of recurrence today Recommend regular full body skin exams Recommend daily broad spectrum sunscreen SPF 30+ to sun-exposed areas, reapply every 2 hours as needed.  Call if any  new or changing lesions are noted between office visits   AK (ACTINIC KERATOSIS) (7) face, scalp (7) ACTINIC DAMAGE - chronic, secondary to cumulative UV radiation exposure/sun exposure over time - diffuse scaly erythematous macules with underlying dyspigmentation - Recommend daily broad spectrum sunscreen SPF 30+ to sun-exposed areas, reapply every 2 hours as needed.  - Recommend staying in the shade or wearing long sleeves, sun glasses (UVA+UVB protection) and wide brim hats (4-inch brim around the entire circumference of the hat). - Call for new or changing lesions.  Destruction of lesion - face, scalp (7) Complexity: simple   Destruction method: cryotherapy   Informed consent: discussed and consent obtained   Timeout:  patient name, date of birth, surgical site, and procedure verified Lesion destroyed using liquid nitrogen: Yes   Region frozen until ice ball extended beyond lesion: Yes   Outcome: patient tolerated procedure well with no complications   Post-procedure details: wound care instructions given     Return in about 1 year (around 09/24/2025) for TBSE, hx of Dysplastic nevus .  IFay Kirks, CMA, am acting as scribe for Alm Rhyme, MD .   Documentation: I have reviewed the above documentation for accuracy and completeness, and I agree with the above.  Alm Rhyme, MD  \

## 2024-09-28 ENCOUNTER — Encounter: Payer: Self-pay | Admitting: Dermatology

## 2025-09-28 ENCOUNTER — Ambulatory Visit: Admitting: Dermatology
